# Patient Record
Sex: Male | Born: 1935 | Race: White | Hispanic: No | Marital: Married | Smoking: Former smoker
Health system: Southern US, Community
[De-identification: ages and names within clinical notes are randomized; demographics above are authoritative.]

## PROBLEM LIST (undated history)

## (undated) DIAGNOSIS — E785 Hyperlipidemia, unspecified: Secondary | ICD-10-CM

## (undated) DIAGNOSIS — I10 Essential (primary) hypertension: Secondary | ICD-10-CM

## (undated) DIAGNOSIS — F0390 Unspecified dementia without behavioral disturbance: Secondary | ICD-10-CM

## (undated) DIAGNOSIS — F0280 Dementia in other diseases classified elsewhere without behavioral disturbance: Secondary | ICD-10-CM

## (undated) DIAGNOSIS — G309 Alzheimer's disease, unspecified: Secondary | ICD-10-CM

## (undated) DIAGNOSIS — G5790 Unspecified mononeuropathy of unspecified lower limb: Secondary | ICD-10-CM

## (undated) HISTORY — PX: EYE SURGERY: SHX253

## (undated) HISTORY — PX: HERNIA REPAIR: SHX51

---

## 1998-06-25 ENCOUNTER — Inpatient Hospital Stay (HOSPITAL_COMMUNITY): Admission: AD | Admit: 1998-06-25 | Discharge: 1998-06-27 | Payer: Self-pay | Admitting: *Deleted

## 2001-02-24 ENCOUNTER — Encounter: Payer: Self-pay | Admitting: Family Medicine

## 2001-02-24 ENCOUNTER — Encounter: Admission: RE | Admit: 2001-02-24 | Discharge: 2001-02-24 | Payer: Self-pay | Admitting: Family Medicine

## 2001-03-29 ENCOUNTER — Encounter: Payer: Self-pay | Admitting: General Surgery

## 2001-03-30 ENCOUNTER — Ambulatory Visit (HOSPITAL_COMMUNITY): Admission: RE | Admit: 2001-03-30 | Discharge: 2001-03-31 | Payer: Self-pay | Admitting: General Surgery

## 2006-03-25 ENCOUNTER — Ambulatory Visit: Payer: Self-pay | Admitting: Gastroenterology

## 2006-04-08 ENCOUNTER — Ambulatory Visit: Payer: Self-pay | Admitting: Gastroenterology

## 2006-05-10 ENCOUNTER — Encounter: Admission: RE | Admit: 2006-05-10 | Discharge: 2006-05-10 | Payer: Self-pay | Admitting: Family Medicine

## 2006-07-01 ENCOUNTER — Encounter: Admission: RE | Admit: 2006-07-01 | Discharge: 2006-07-01 | Payer: Self-pay | Admitting: Family Medicine

## 2007-07-18 ENCOUNTER — Encounter: Admission: RE | Admit: 2007-07-18 | Discharge: 2007-07-18 | Payer: Self-pay | Admitting: General Surgery

## 2007-08-15 ENCOUNTER — Ambulatory Visit (HOSPITAL_COMMUNITY): Admission: RE | Admit: 2007-08-15 | Discharge: 2007-08-17 | Payer: Self-pay | Admitting: General Surgery

## 2007-08-15 ENCOUNTER — Encounter (INDEPENDENT_AMBULATORY_CARE_PROVIDER_SITE_OTHER): Payer: Self-pay | Admitting: General Surgery

## 2009-02-25 ENCOUNTER — Telehealth (INDEPENDENT_AMBULATORY_CARE_PROVIDER_SITE_OTHER): Payer: Self-pay

## 2009-02-26 ENCOUNTER — Ambulatory Visit: Payer: Self-pay

## 2009-02-26 ENCOUNTER — Encounter: Payer: Self-pay | Admitting: Cardiovascular Disease

## 2011-04-07 NOTE — Op Note (Signed)
NAMEDANZELL, BIRKY NO.:  [id number]   MEDICAL RECORD NO.:  [id number]          PATIENT TYPE:  AMB   LOCATION:  SDS                          FACILITY:  [facility]   PHYSICIAN:  Ollen Gross. Vernell Morgans, M.D. DATE OF BIRTH:  1927/11/21   DATE OF PROCEDURE:  08/15/2007  DATE OF DISCHARGE:                               OPERATIVE REPORT   PREOPERATIVE DIAGNOSIS:  Bilateral inguinal hernias.  The right side is  recurrent.   POSTOPERATIVE DIAGNOSIS:  Bilateral inguinal hernias.  The right side is  recurrent.   PROCEDURE:  Bilateral inguinal hernia repairs with mesh.   SURGEON:  Ollen Gross. Vernell Morgans, M.D.   ASSISTANT:  Currie Paris, M.D.   ANESTHESIA:  General endotracheal.   PROCEDURE:  After informed consent was obtained, the patient was brought  to the operating room and placed in supine position on the operating  room table.  After induction of general anesthesia, the patient's  abdomen and both groins were prepped with Betadine and draped in the  usual sterile manner.   Attention was first turned to the right inguinal region.  This was the  recurrent side.  This area was infiltrated with 0.25% Marcaine.  A  incision was made from the edge of the pubic tubercle on the right, and  along the patient's old incision.  This incision was carried down  through the skin and subcutaneous tissue sharply with the  electrocautery, until the fascia of the external oblique was  encountered.  We did encounter a fair bit of scar tissue, but were able  to identify the layers.  The external oblique was then opened along its  fibers towards the apex of the external ring with Metzenbaum scissors  using blunt hemostat dissection as well as some sharp dissection with  electrocautery.  The space between the transversalis muscle and external  oblique fascia were able to be identified and separated.  The patient  had a small area of fat in the inguinal canal, that I think corresponded  to  the CT scan.  It was not clear whether this was some preperitoneal  fat herniating into the canal.  It did not appear to be a hernia sac.  It was ligated near its base with a hemostat and 3-0 silk tie.  There  was a small defect at the internal ring.  A small mesh plug was placed  through this defect, and then anchored to the edge of the defect where  the old piece of mesh was with interrupted 2-0 Prolene stitches.  This  seemed to close that defect very well.  The external oblique was then  reapproximated with a running 2-0 Vicryl stitch.  The subcutaneous  tissue was closed with a running 3-0 Vicryl stitch, and the skin was  closed with a running 4-0 Monocryl subcuticular stitch.  Attention was  then turned to the left side.  The left inguinal region was infiltrated  with 0.25% Marcaine.  An incision was made from the edge of the pubic  tubercle on the left, towards the anterior cephalic spine.  This  incision was carried down through the skin and subcutaneous tissue  sharply with the electrocautery, until the fascia of the external  oblique was encountered.  The fascia of the external oblique was opened  along its fibers with a 15 blade knife and Metzenbaum scissors.  A  Weitlander retractor was deployed.  Using blunt dissection the cord  structures were able to be dissected free and surrounded between two  fingers at the edge of the pubic tubercle.  A 1/2-inch Penrose drain was  then placed around the cord structures for retraction purposes.  The  cord structure was then gently skeletonized by a combination of blunt  hemostat dissection and some sharp dissection with electrocautery.  The  hernia sac was able to be identified.  It was gently separated from the  rest of the cord structures.  The sac was then opened sharply with  Metzenbaum scissors.  There were no visceral contents within the sac.  The sac was then ligated near its base with a 2-0 silk suture ligature.  The distal sac  was excised and sent to pathology.  The stump of the sac  was allowed to retract back beneath the transversalis layer.   Next, a 3x6 piece of UltraPro mesh was chosen and cut to fit.  The mesh  was sewed inferiorly to the shelving edge of inguinal ligament with a  running 2-0 Prolene stitch.  Tails were cut in the mesh laterally.  These tails were wrapped around the cord structures and anchored lateral  to the cord and to the shelving edge of the inguinal ligament with an  interrupted 2-0 Prolene stitch superiorly.  The mesh was sewed to the  muscular aponeurotic strength layer of the transversalis with  interrupted 2-0 Prolene vertical mattress stitches.  The wound was  irrigated with copious amounts of saline.  At this point the hernia  defect was well repaired and the mesh was in good position without any  tension.  The external oblique fascia was then reapproximated with a  running 2-0 Vicryl stitch.  Prior to this, the ilioinguinal nerve was  also identified at the beginning of the case; it was dissected out and  clamped proximally and distally with the hemostats; divided and ligated  with 3-0 silk ties.  We never did see an ilioinguinal nerve really on  the right side that we were sure was nerve.  We did find structure that  looked like some nerve.  It was clamped with a hemostat, divided and  ligated with 3-0 silk ties as well.   Next, once the external oblique was approximated the wound was  infiltrated more 0.25% Marcaine.  The subcutaneous fascia was closed  with a running 3-0 Vicryl stitch, and the skin was closed with a running  4-0 Monocryl subcuticular stitch.  Dermabond dressing was applied.  The  patient tolerated the procedure well.  At the end of the case all  needle, sponge and instrument counts were correct.  The patient was then  awakened, taken to recovery room in stable condition.  Both testicles  were in the scrotum at the end the case.      Ollen Gross. Vernell Morgans,  M.D.  Electronically Signed     PST/MEDQ  D:  08/15/2007  T:  08/15/2007  Job:  [id number]

## 2011-04-10 NOTE — Op Note (Signed)
[place]  Patient:    David Vaughan, David Vaughan                       MRN: [id number] Proc. Date: 03/30/01 Adm. Date:  [id number] Disc. Date: [id number] Attending:  Tempie Donning CC:         Quita Skye. Artis Flock, M.D.   Operative Report  PREOPERATIVE DIAGNOSIS:  Repair of right inguinal hernia, primarily direct, with preperitoneal and onlay Prolene mesh.  SURGEON:  Gita Kudo, M.D.  ANESTHESIA:  MAC, IV sedation, local 1% Xylocaine, 0.5% Marcaine.  PREOPERATIVE DIAGNOSIS:  Right inguinal hernia.  POSTOPERATIVE DIAGNOSIS:  Right inguinal hernia, indirect.  CLINICAL SUMMARY:  Very pleasant 77 year old retired gentleman with bulge in his right groin and physical examination revealed a hernia.  OPERATIVE FINDINGS:  The patient had a medium-sized right indirect inguinal hernia that was empty of contents at time of surgery.  There was a weakening of the floor of the canal but no definite direct hernia.  DESCRIPTION OF PROCEDURE:  Under satisfactory IV sedation, having received 1 g of Ancef preoperatively, the patients abdomen and genitalia were prepped and draped in the standard fashion.  The above local was infiltrated throughout for good analgesia using approximately 60-80 cc.  A transverse incision was made in the right lower quadrant and carried down to and through the external ring and external oblique.  Bleeders clamped or coagulated.  They were tied with 3-0 Vicryl.  With good self-retaining retractors giving exposure, the cord and its contents were mobilized and the indirect sac identified and dissected high.  It was noted to be empty, twisted and controlled at the neck with 0 Prolene suture ligature and the end of the suture left long.  Then the floor of the canal was opened medially from the pubis, to the internal ring. The preperitoneal contents were reduced by finger dissection and held in place with a moist gauze.  A portion of 3 x 6  inch piece of mesh was tailored into an oval to fit into the space and it was anchored at the Coopers ligament with a 0 Prolene suture.  It then unfolded laterally and inferiorly.  The gauze was removed and the upper portion of the mesh unfolded medially and superiorly, covering the preperitoneal space well.  Then the floor was approximated over this with a running 0 Prolene suture taking intermittent bites of the mesh and at the internal ring, when tied, the ring was snug and the end of the suture left long.  The remainder of the mesh was then cut into an oval with a slit to go around the cord structures.  It was anchored at the ring with the previous suture and then tacked around the periphery under slight tension with 0 Prolene to the internal oblique above and the inguinal ligament below.  The tails were then sutured to each other in the fascia above and lateral to the cord.  The wound was then lavaged with saline and closed in layers with running 2-0 Vicryl for external oblique, interrupted 2-0 Vicryl for the deep fascia, and 3-0 Vicryl for the subcu.  Steri-Strips approximated the skin edges and sterile absorbing dressings applied.  There were no complications. DD:  03/30/01 TD:  03/30/01 Job: [id number]

## 2011-08-12 ENCOUNTER — Emergency Department (HOSPITAL_BASED_OUTPATIENT_CLINIC_OR_DEPARTMENT_OTHER)
Admission: EM | Admit: 2011-08-12 | Discharge: 2011-08-12 | Disposition: A | Discharge status: Home / Self Care | Payer: Medicare Other | Attending: Emergency Medicine | Admitting: Emergency Medicine

## 2011-08-12 DIAGNOSIS — Z79899 Other long term (current) drug therapy: Secondary | ICD-10-CM | POA: Insufficient documentation

## 2011-08-12 DIAGNOSIS — G309 Alzheimer's disease, unspecified: Secondary | ICD-10-CM | POA: Insufficient documentation

## 2011-08-12 DIAGNOSIS — W1809XA Striking against other object with subsequent fall, initial encounter: Secondary | ICD-10-CM | POA: Insufficient documentation

## 2011-08-12 DIAGNOSIS — I10 Essential (primary) hypertension: Secondary | ICD-10-CM | POA: Insufficient documentation

## 2011-08-12 DIAGNOSIS — S0101XA Laceration without foreign body of scalp, initial encounter: Secondary | ICD-10-CM

## 2011-08-12 DIAGNOSIS — E785 Hyperlipidemia, unspecified: Secondary | ICD-10-CM | POA: Insufficient documentation

## 2011-08-12 DIAGNOSIS — F0280 Dementia in other diseases classified elsewhere without behavioral disturbance: Secondary | ICD-10-CM | POA: Insufficient documentation

## 2011-08-12 DIAGNOSIS — S0100XA Unspecified open wound of scalp, initial encounter: Secondary | ICD-10-CM | POA: Insufficient documentation

## 2011-08-12 HISTORY — DX: Hyperlipidemia, unspecified: E78.5

## 2011-08-12 HISTORY — DX: Essential (primary) hypertension: I10

## 2011-08-12 HISTORY — DX: Alzheimer's disease, unspecified: G30.9

## 2011-08-12 HISTORY — DX: Dementia in other diseases classified elsewhere, unspecified severity, without behavioral disturbance, psychotic disturbance, mood disturbance, and anxiety: F02.80

## 2011-08-12 MED ORDER — LIDOCAINE-EPINEPHRINE-TETRACAINE (LET) SOLUTION
NASAL | Status: AC
  Administered 2011-08-12: 3 mL via TOPICAL
  Filled 2011-08-12: qty 3

## 2011-08-12 MED ORDER — TETANUS-DIPHTHERIA TOXOIDS TD 5-2 LFU IM INJ
0.5000 mL | INJECTION | Freq: Once | INTRAMUSCULAR | Status: AC
  Administered 2011-08-12: 0.5 mL via INTRAMUSCULAR
  Filled 2011-08-12: qty 0.5

## 2011-08-12 MED ORDER — LIDOCAINE-EPINEPHRINE-TETRACAINE (LET) SOLUTION
3.0000 mL | Freq: Once | NASAL | Status: AC
  Administered 2011-08-12: 3 mL via TOPICAL

## 2011-08-12 MED ORDER — TETANUS-DIPHTHERIA TOXOIDS TD 5-2 LFU IM INJ: 0.5000 mL | INJECTION | Freq: Once | INTRAMUSCULAR | Status: DC

## 2011-08-12 NOTE — ED Provider Notes (Signed)
History     CSN: [id number] Arrival date & time: 08/12/2011  8:20 AM   Chief Complaint  Patient presents with  . Fall  . Head Laceration     (Include location/radiation/quality/duration/timing/severity/associated sxs/prior treatment) Patient is a 77 y.o. male presenting with fall and scalp laceration. The history is provided by the EMS personnel and the patient.  Fall  Head Laceration   patient fell today and struck his head against a sharp object. No loss of consciousness patient ambulatory EMS was called patient is at his baseline currently nothing makes the symptoms better or worse denies neck back chest or abdomen pain no weakness or numbness in his extremities there was bandage  applied by EMS   Past Medical History  Diagnosis Date  . Hypertension   . Hyperlipidemia   . Alzheimer disease      No past surgical history on file.  No family history on file.  History  Substance Use Topics  . Smoking status: Not on file  . Smokeless tobacco: Not on file  . Alcohol Use:       Review of Systems  All other systems reviewed and are negative.    Allergies  Codeine and Iohexol  Home Medications   Current Outpatient Rx  Name Route Sig Dispense Refill  . DIVALPROEX SODIUM 125 MG PO CPSP Oral Take 125 mg by mouth 2 (two) times daily.      . DONEPEZIL HCL 10 MG PO TABS Oral Take 10 mg by mouth at bedtime as needed.      Marland Kitchen SIMVASTATIN 10 MG PO TABS Oral Take 10 mg by mouth at bedtime.        Physical Exam    BP 130/66  Pulse 57  Temp(Src) 98.4 F (36.9 C) (Oral)  Resp 16  Wt 210 lb (95.255 kg)  SpO2 98%  Physical Exam  Nursing note and vitals reviewed. Constitutional: He is oriented to person, place, and time. Vital signs are normal. He appears well-developed and well-nourished.  Non-toxic appearance. No distress.  HENT:  Head: Head is with laceration. Head is without Battle's sign.    Eyes: Conjunctivae and EOM are normal. Pupils are equal, round, and  reactive to light.  Neck: Normal range of motion. Neck supple. No tracheal deviation present.  Cardiovascular: Normal rate, regular rhythm and normal heart sounds.  Exam reveals no gallop.   No murmur heard. Pulmonary/Chest: Effort normal and breath sounds normal. No stridor. No respiratory distress. He has no wheezes.  Abdominal: Soft. Normal appearance and bowel sounds are normal. He exhibits no distension. There is no tenderness. There is no rebound.  Musculoskeletal: Normal range of motion. He exhibits no edema and no tenderness.  Neurological: He is alert and oriented to person, place, and time. He has normal strength. No cranial nerve deficit or sensory deficit. GCS eye subscore is 4. GCS verbal subscore is 5. GCS motor subscore is 6.  Skin: Skin is warm and dry.  Psychiatric: He has a normal mood and affect. His speech is normal and behavior is normal.    ED Course  LACERATION REPAIR Date/Time: 08/12/2011 8:36 AM Performed by: Toy Baker Authorized by: Lorre Nick T Consent: Verbal consent not obtained. The procedure was performed in an emergent situation. Risks and benefits: risks, benefits and alternatives were discussed Patient identity confirmed: arm band Body area: head/neck Location details: scalp Laceration length: 5 cm Foreign bodies: no foreign bodies Vascular damage: no Patient sedated: no Irrigation solution: saline Amount of  cleaning: standard Debridement: none Degree of undermining: none Skin closure: staples Number of sutures: 5 Dressing: gauze roll Patient tolerance: Patient tolerated the procedure well with no immediate complications.    No results found for this or any previous visit. No results found.   No diagnosis found.   MDM Tetanus updated by nursing and discharge instructions given       Toy Baker, MD 08/12/11 [phone]

## 2011-08-12 NOTE — ED Notes (Signed)
Pt denies pain, shob, dizziness. Pt alert and smiling. Wife with pt.

## 2011-08-12 NOTE — ED Notes (Signed)
Pt is at baseline per family.

## 2011-08-12 NOTE — ED Notes (Signed)
Unwitnessed fall while ambulating.  Pt struck head on dresser.  Laceration to scalp.  Denies LOC.

## 2011-08-19 ENCOUNTER — Encounter (HOSPITAL_BASED_OUTPATIENT_CLINIC_OR_DEPARTMENT_OTHER): Payer: Self-pay | Admitting: *Deleted

## 2011-08-19 ENCOUNTER — Emergency Department (HOSPITAL_BASED_OUTPATIENT_CLINIC_OR_DEPARTMENT_OTHER)
Admission: EM | Admit: 2011-08-19 | Discharge: 2011-08-19 | Disposition: A | Discharge status: Home / Self Care | Payer: Medicare Other | Attending: Emergency Medicine | Admitting: Emergency Medicine

## 2011-08-19 DIAGNOSIS — IMO0002 Reserved for concepts with insufficient information to code with codable children: Secondary | ICD-10-CM

## 2011-08-19 DIAGNOSIS — Z4802 Encounter for removal of sutures: Secondary | ICD-10-CM | POA: Insufficient documentation

## 2011-08-19 NOTE — ED Notes (Signed)
Patient is here for staple removal from posterior head.  Patient has hx alzheimers .

## 2011-08-19 NOTE — ED Provider Notes (Signed)
History     CSN: [id number] Arrival date & time: 08/19/2011 11:07 AM  Chief Complaint  Patient presents with  . Suture / Staple Removal    (Consider location/radiation/quality/duration/timing/severity/associated sxs/prior treatment) HPI Comments: Patient had a scalp laceration one week ago today. He was seen in this emergency department and had staples placed. Since that time he is showered with a cap Covering his scalp and his wife is placed antibiotic ointment on it more than once a day. Patient is otherwise well. There's been no drainage or fevers. In there is a scab over the site. They present here for staple removal. The wife does give most of the history since the patient himself has Alzheimer's.  Patient is a 77 y.o. male presenting with suture removal. The history is provided by the spouse, medical records and the patient. No language interpreter was used.  Suture / Staple Removal  The sutures were placed 7 to 10 days ago. Treatments since wound repair include antibiotic ointment use. There has been no drainage from the wound. There is no redness present. There is no swelling present. The pain has improved.    Past Medical History  Diagnosis Date  . Hypertension   . Hyperlipidemia   . Alzheimer disease     Past Surgical History  Procedure Date  . Hernia repair   . Eye surgery     No family history on file.  History  Substance Use Topics  . Smoking status: Never Smoker   . Smokeless tobacco: Not on file  . Alcohol Use: No      Review of Systems  Constitutional: Negative.  Negative for fever and chills.  HENT: Negative.   Eyes: Negative.  Negative for discharge and redness.  Respiratory: Negative.  Negative for cough and shortness of breath.   Cardiovascular: Negative.  Negative for chest pain.  Gastrointestinal: Negative.  Negative for nausea, vomiting and abdominal pain.  Genitourinary: Negative.  Negative for hematuria.  Musculoskeletal: Negative.  Negative  for back pain.  Skin: Positive for wound. Negative for color change and rash.  Neurological: Negative for syncope and headaches.  Hematological: Negative.  Negative for adenopathy.  Psychiatric/Behavioral: Negative.  Negative for confusion.  All other systems reviewed and are negative.    Allergies  Codeine and Iohexol  Home Medications   Current Outpatient Rx  Name Route Sig Dispense Refill  . DIVALPROEX SODIUM 125 MG PO CPSP Oral Take 125 mg by mouth 2 (two) times daily.      . DONEPEZIL HCL 10 MG PO TABS Oral Take 10 mg by mouth at bedtime as needed.      Marland Kitchen SIMVASTATIN 10 MG PO TABS Oral Take 10 mg by mouth at bedtime.        BP 120/67  Pulse 67  Temp(Src) 98.3 F (36.8 C) (Oral)  Resp 18  SpO2 100%  Physical Exam  Constitutional: He appears well-developed and well-nourished.  HENT:       In the posterior right scalp there is a 5 cm wound. By staples are in place. There is a scabbing crusting at the site but no erythema or drainage. The wound appears well-healed  Eyes: Conjunctivae and EOM are normal. Pupils are equal, round, and reactive to light.  Neck: Normal range of motion. Neck supple.  Pulmonary/Chest: Effort normal.  Neurological: He is alert.  Skin: Skin is warm and dry.  Psychiatric: He has a normal mood and affect. His behavior is normal.    ED Course  SUTURE  REMOVAL Date/Time: 08/19/2011 11:14 AM Performed by: Emeline General A Authorized by: Emeline General A Consent: Verbal consent obtained. Written consent not obtained. Risks and benefits: risks, benefits and alternatives were discussed Consent given by: patient and spouse Patient understanding: patient states understanding of the procedure being performed Patient consent: the patient's understanding of the procedure matches consent given Patient identity confirmed: verbally with patient Body area: head/neck Location details: scalp Wound Appearance: clean Staples Removed: 5 Facility: sutures  placed in this facility Patient tolerance: Patient tolerated the procedure well with no immediate complications.   (including critical care time)  Labs Reviewed - No data to display No results found.   No diagnosis found.    MDM  Patient had staples removed without further complications. He'll be discharged home. They've been given further wound care instruction as well.        Nat Christen, MD 08/19/11 1115

## 2011-09-03 LAB — DIFFERENTIAL
Basophils Absolute: 0
Basophils Relative: 0
Eosinophils Absolute: 0.1
Neutrophils Relative %: 65

## 2011-09-03 LAB — CBC
MCHC: 33.3
MCV: 93.5
Platelets: 190
WBC: 6.1

## 2011-09-03 LAB — BASIC METABOLIC PANEL
BUN: 22
CO2: 27
Chloride: 101
Creatinine, Ser: 0.88

## 2011-09-22 ENCOUNTER — Other Ambulatory Visit: Payer: Self-pay | Admitting: Dermatology

## 2012-03-14 ENCOUNTER — Other Ambulatory Visit: Payer: Self-pay | Admitting: Dermatology

## 2012-08-23 ENCOUNTER — Emergency Department (HOSPITAL_COMMUNITY)
Admission: EM | Admit: 2012-08-23 | Discharge: 2012-08-24 | Disposition: A | Discharge status: Skilled Nursing Facility | Payer: Medicare Other | Attending: Emergency Medicine | Admitting: Emergency Medicine

## 2012-08-23 ENCOUNTER — Emergency Department (HOSPITAL_COMMUNITY): Payer: Medicare Other

## 2012-08-23 ENCOUNTER — Encounter (HOSPITAL_COMMUNITY): Payer: Self-pay

## 2012-08-23 DIAGNOSIS — S0003XA Contusion of scalp, initial encounter: Secondary | ICD-10-CM | POA: Insufficient documentation

## 2012-08-23 DIAGNOSIS — G5790 Unspecified mononeuropathy of unspecified lower limb: Secondary | ICD-10-CM | POA: Insufficient documentation

## 2012-08-23 DIAGNOSIS — W19XXXA Unspecified fall, initial encounter: Secondary | ICD-10-CM

## 2012-08-23 DIAGNOSIS — S0083XA Contusion of other part of head, initial encounter: Secondary | ICD-10-CM

## 2012-08-23 DIAGNOSIS — E785 Hyperlipidemia, unspecified: Secondary | ICD-10-CM | POA: Insufficient documentation

## 2012-08-23 DIAGNOSIS — F0280 Dementia in other diseases classified elsewhere without behavioral disturbance: Secondary | ICD-10-CM | POA: Insufficient documentation

## 2012-08-23 DIAGNOSIS — S1093XA Contusion of unspecified part of neck, initial encounter: Secondary | ICD-10-CM | POA: Insufficient documentation

## 2012-08-23 DIAGNOSIS — G309 Alzheimer's disease, unspecified: Secondary | ICD-10-CM | POA: Insufficient documentation

## 2012-08-23 DIAGNOSIS — F0390 Unspecified dementia without behavioral disturbance: Secondary | ICD-10-CM

## 2012-08-23 DIAGNOSIS — I10 Essential (primary) hypertension: Secondary | ICD-10-CM | POA: Insufficient documentation

## 2012-08-23 DIAGNOSIS — W07XXXA Fall from chair, initial encounter: Secondary | ICD-10-CM | POA: Insufficient documentation

## 2012-08-23 HISTORY — DX: Unspecified dementia, unspecified severity, without behavioral disturbance, psychotic disturbance, mood disturbance, and anxiety: F03.90

## 2012-08-23 HISTORY — DX: Unspecified mononeuropathy of unspecified lower limb: G57.90

## 2012-08-23 NOTE — ED Notes (Signed)
Pt from [facility] facility. Pt stood up from chair in an up right position and fell face down to a hard floor that was witnessed. Pt has a hx of Alz. Disease. Denies any pain. Pt is calm, alert and orientx1. Staff noticed that after fall pt hands began twitching.

## 2012-08-23 NOTE — ED Provider Notes (Signed)
History     CSN: [id number]  Arrival date & time 08/23/12  2208   First MD Initiated Contact with Patient 08/23/12 2240      Chief Complaint  Patient presents with  . Fall    (Consider location/radiation/quality/duration/timing/severity/associated sxs/prior treatment) Patient is a 78 y.o. male presenting with fall. The history is provided by the patient and the spouse. The history is limited by the condition of the patient.  Fall  pt from [facility], hx advanced dementia, got up from chair without his walker, witnessed fall, hit head, contusion to forehead. No loc. Mental status c/w baseline. No vomiting. Pt w advanced dementia, limited hx, level 5 caveat.      Past Medical History  Diagnosis Date  . Hypertension   . Hyperlipidemia   . Alzheimer disease   . Dementia   . Neuropathy of lower extremity     Past Surgical History  Procedure Date  . Hernia repair   . Eye surgery     No family history on file.  History  Substance Use Topics  . Smoking status: Former Games developer  . Smokeless tobacco: Not on file  . Alcohol Use: No      Review of Systems  Unable to perform ROS: Dementia    Allergies  Codeine and Iohexol  Home Medications   Current Outpatient Rx  Name Route Sig Dispense Refill  . DIVALPROEX SODIUM 125 MG PO CPSP Oral Take 125 mg by mouth 2 (two) times daily.      . DONEPEZIL HCL 10 MG PO TABS Oral Take 10 mg by mouth at bedtime as needed.      Marland Kitchen SIMVASTATIN 10 MG PO TABS Oral Take 10 mg by mouth at bedtime.        BP 124/76  Pulse 67  Temp 97.6 F (36.4 C) (Oral)  Resp 18  SpO2 99%  Physical Exam  Nursing note and vitals reviewed. Constitutional: He appears well-developed and well-nourished. No distress.  HENT:  Nose: Nose normal.  Mouth/Throat: Oropharynx is clear and moist.       Contusion to forehead  Eyes: Pupils are equal, round, and reactive to light.  Neck: Neck supple. No tracheal deviation present.  Cardiovascular: Normal rate,  regular rhythm, normal heart sounds and intact distal pulses.   Pulmonary/Chest: Effort normal and breath sounds normal. No accessory muscle usage. No respiratory distress. He exhibits no tenderness.  Abdominal: Soft. He exhibits no distension. There is no tenderness.  Musculoskeletal: Normal range of motion. He exhibits no edema and no tenderness.       CTLS spine, non tender, aligned, no step off. Good rom bil ext without focal sts, pain or bony tenderness.  Neurological: He is alert.       Alert, content. Moves bil extremities purposefully. Ms c/w baseline per spouse.   Skin: Skin is warm and dry.    ED Course  Procedures (including critical care time)  Ct Head Wo Contrast  08/23/2012  *RADIOLOGY REPORT*  Clinical Data: Fall.  Frontal head injury.  CT HEAD WITHOUT CONTRAST  Technique:  Contiguous axial images were obtained from the base of the skull through the vertex without contrast.  Comparison: 05/10/2006  Findings: No evidence for acute hemorrhage.  No mass lesion or acute ischemia by CT.  No abnormal extra-axial fluid collection. Diffuse loss of parenchymal volume is consistent with atrophy. Patchy low attenuation in the deep hemispheric and periventricular white matter is nonspecific, but likely reflects chronic microvascular ischemic demyelination.  The  lateral ventricles are enlarged bilaterally.  This was true previously, but has progressed in the interval along with the progression of the diffuse parenchymal volume loss.  Visualized paranasal sinuses are clear.  No evidence for skull fracture.  Focal scalp swelling is seen in the left high frontal parietal region.  IMPRESSION: No CT evidence for acute intracranial abnormality.  Advanced atrophy with chronic small vessel white matter disease. The lateral ventricles are more prominent than before and while this most likely represents progression of central atrophy, hydrocephalus could have a similar appearance.   Original Report  Authenticated By: ERIC A. MANSELL, M.D.       MDM  Ct.  Reviewed nursing notes and prior charts for additional history.   Recheck alert, content. Nad. Appears stable for d/c.         Suzi Roots, MD 08/23/12 2321

## 2012-08-23 NOTE — ED Notes (Signed)
MWU:XL24<MW> Expected date:08/23/12<BR> Expected time: 9:55 PM<BR> Means of arrival:Ambulance<BR> Comments:<BR> Fall  85 F

## 2012-09-18 ENCOUNTER — Emergency Department (HOSPITAL_COMMUNITY)
Admission: EM | Admit: 2012-09-18 | Discharge: 2012-09-18 | Disposition: A | Discharge status: Skilled Nursing Facility | Payer: Medicare Other | Attending: Emergency Medicine | Admitting: Emergency Medicine

## 2012-09-18 ENCOUNTER — Emergency Department (HOSPITAL_COMMUNITY): Payer: Medicare Other

## 2012-09-18 ENCOUNTER — Encounter (HOSPITAL_COMMUNITY): Payer: Self-pay | Admitting: Nurse Practitioner

## 2012-09-18 DIAGNOSIS — Z79899 Other long term (current) drug therapy: Secondary | ICD-10-CM | POA: Insufficient documentation

## 2012-09-18 DIAGNOSIS — R233 Spontaneous ecchymoses: Secondary | ICD-10-CM

## 2012-09-18 DIAGNOSIS — G309 Alzheimer's disease, unspecified: Secondary | ICD-10-CM | POA: Insufficient documentation

## 2012-09-18 DIAGNOSIS — M7989 Other specified soft tissue disorders: Secondary | ICD-10-CM

## 2012-09-18 DIAGNOSIS — I10 Essential (primary) hypertension: Secondary | ICD-10-CM | POA: Insufficient documentation

## 2012-09-18 DIAGNOSIS — G5790 Unspecified mononeuropathy of unspecified lower limb: Secondary | ICD-10-CM | POA: Insufficient documentation

## 2012-09-18 DIAGNOSIS — E785 Hyperlipidemia, unspecified: Secondary | ICD-10-CM | POA: Insufficient documentation

## 2012-09-18 DIAGNOSIS — T148XXA Other injury of unspecified body region, initial encounter: Secondary | ICD-10-CM

## 2012-09-18 DIAGNOSIS — F0280 Dementia in other diseases classified elsewhere without behavioral disturbance: Secondary | ICD-10-CM | POA: Insufficient documentation

## 2012-09-18 DIAGNOSIS — M7981 Nontraumatic hematoma of soft tissue: Secondary | ICD-10-CM | POA: Insufficient documentation

## 2012-09-18 DIAGNOSIS — Z87891 Personal history of nicotine dependence: Secondary | ICD-10-CM | POA: Insufficient documentation

## 2012-09-18 DIAGNOSIS — Z7982 Long term (current) use of aspirin: Secondary | ICD-10-CM | POA: Insufficient documentation

## 2012-09-18 DIAGNOSIS — G609 Hereditary and idiopathic neuropathy, unspecified: Secondary | ICD-10-CM | POA: Insufficient documentation

## 2012-09-18 LAB — POCT I-STAT, CHEM 8
BUN: 23 mg/dL (ref 6–23)
Calcium, Ion: 1.14 mmol/L (ref 1.13–1.30)
Chloride: 103 mEq/L (ref 96–112)
Creatinine, Ser: 0.8 mg/dL (ref 0.50–1.35)
Glucose, Bld: 90 mg/dL (ref 70–99)

## 2012-09-18 LAB — CBC
HCT: 40.8 % (ref 39.0–52.0)
MCV: 96.7 fL (ref 78.0–100.0)
RBC: 4.22 MIL/uL (ref 4.22–5.81)
WBC: 6 10*3/uL (ref 4.0–10.5)

## 2012-09-18 MED ORDER — MIDAZOLAM HCL 2 MG/2ML IJ SOLN
2.0000 mg | Freq: Once | INTRAMUSCULAR | Status: AC
  Administered 2012-09-18: 2 mg via INTRAMUSCULAR
  Filled 2012-09-18: qty 2

## 2012-09-18 MED ORDER — MIDAZOLAM HCL 2 MG/2ML IJ SOLN
INTRAMUSCULAR | Status: AC
  Administered 2012-09-18: 3 mg
  Filled 2012-09-18: qty 4

## 2012-09-18 MED ORDER — MIDAZOLAM HCL 2 MG/2ML IJ SOLN: 3.0000 mg | Freq: Once | INTRAMUSCULAR | Status: AC

## 2012-09-18 NOTE — ED Notes (Signed)
Spoke with pt's wife on phone regarding plan of care.

## 2012-09-18 NOTE — ED Provider Notes (Addendum)
Medical screening examination/treatment/procedure(s) were conducted as a shared visit with non-physician practitioner(s) and myself.  I personally evaluated the patient during the encounter  Doug Sou, MD 09/18/12 1708  Patient had x-rays of wrist and forearm obtained. He requires an x-ray of his right hand to rule out fracture. He requires sedation per x-ray technicians due to his tendency to be violent. This is baseline per his wife who accompanies him an additional Versed 2 mg IM ordered by me prior to sending patient back to x-ray  Doug Sou, MD 09/18/12 1737

## 2012-09-18 NOTE — ED Notes (Signed)
[facility] arrived at pt room transferred pt onto mobile stretcher. Report given.

## 2012-09-18 NOTE — ED Notes (Signed)
X 2 side rails  

## 2012-09-18 NOTE — Progress Notes (Signed)
VASCULAR LAB PRELIMINARY  PRELIMINARY  PRELIMINARY  PRELIMINARY  Right upper extremity venous Doppler completed.    Preliminary report:  No obvious evidence of DVT or SVT noted in the right upper extremity.  Technically difficult study secondary to combativeness.  Cordarro Spinnato, 09/18/2012, 3:17 PM

## 2012-09-18 NOTE — ED Notes (Signed)
Report given to Bowers, CNA at [facility] [phone]).  She states that she will feed pt when he returns.  [facility] called to transport pt.
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
## 2012-09-18 NOTE — ED Notes (Signed)
ems brought pt from SNF [facility] for swelling to R hand onset yesterday, from fingers to forearm. Staff denies any injuries. ems noted small lac to R eyebrow and bruising to L arm also. Pt is alert per norm, staff states pt is normally combative but has not been today.

## 2012-09-18 NOTE — ED Notes (Signed)
CBG 96. 
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
## 2012-09-18 NOTE — ED Notes (Signed)
Placed call to [facility] for transport back to [facility]

## 2012-09-18 NOTE — ED Notes (Signed)
Pt to xray
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
## 2012-09-18 NOTE — ED Provider Notes (Signed)
History     CSN: [id number]  Arrival date & time 09/18/12  1134   First MD Initiated Contact with Patient 09/18/12 1401      Chief Complaint  Patient presents with  . Joint Swelling    (Consider location/radiation/quality/duration/timing/severity/associated sxs/prior treatment) Patient is a 78 y.o. male presenting with hand pain. The history is provided by the patient. No language interpreter was used.  Hand Pain This is a new problem. The current episode started yesterday. The problem occurs constantly. The problem has been gradually worsening. Pertinent negatives include no fever, nausea, vomiting or weakness.   78 year old male coming in with hematemesis used right is in forearm. Staff at the [facility] for right hand swelling started yesterday and it is extending into his wrist and forearm today. Patient is a Alzheimer's patient who is confused combative and not cooperative. Patient's wife is at the bedside and states that he went to the [facility] on 08/03/2012. Patient is not oriented at all. He does surface and follow. He is aggressive and combative. Patient received Versed on admission for combative behavior. Patient is in 4. restraints on the stretcher presently. Wife reports that he does have combative behavior at the nursing facility as well. Past medical history listed below. No acute distress noted good radial pulse on the right is noted for some meter hematoma inside the right wrist. The hand is reddened and edematous.  Past Medical History  Diagnosis Date  . Hypertension   . Hyperlipidemia   . Alzheimer disease   . Dementia   . Neuropathy of lower extremity     Past Surgical History  Procedure Date  . Hernia repair   . Eye surgery     History reviewed. No pertinent family history.  History  Substance Use Topics  . Smoking status: Former Games developer  . Smokeless tobacco: Not on file  . Alcohol Use: No      Review of Systems  Unable to perform  ROS Constitutional: Negative for fever.  Gastrointestinal: Negative for nausea and vomiting.  Neurological: Negative for weakness.    Allergies  Codeine and Iohexol  Home Medications   Current Outpatient Rx  Name Route Sig Dispense Refill  . ASPIRIN 325 MG PO TBEC Oral Take 325 mg by mouth daily.    Marland Kitchen CALCIUM 600 + D PO Oral Take 1 tablet by mouth daily.    Marland Kitchen DIVALPROEX SODIUM ER 500 MG PO TB24 Oral Take 500 mg by mouth daily.    . DONEPEZIL HCL 10 MG PO TABS Oral Take 10 mg by mouth at bedtime as needed.    . DUTASTERIDE 0.5 MG PO CAPS Oral Take 0.5 mg by mouth daily.    . FUROSEMIDE 20 MG PO TABS Oral Take 20 mg by mouth daily. For 27 days. Pt started on 09/11/12    . GABAPENTIN 300 MG PO CAPS Oral Take 300 mg by mouth at bedtime.    . GUAIFENESIN 100 MG/5ML PO LIQD Oral Take 200 mg by mouth every 6 (six) hours as needed. For cough    . LORAZEPAM 2 MG/ML PO CONC Oral Take 0.25 mg by mouth every 6 (six) hours as needed. For agitation and every night at bedtime.    . THEREMS PO Oral Take 1 tablet by mouth daily.    . OMEGA-3-ACID ETHYL ESTERS 1 G PO CAPS Oral Take 2 g by mouth daily.    Marland Kitchen PRAVASTATIN SODIUM 20 MG PO TABS Oral Take 20 mg by mouth  daily.    . QUETIAPINE FUMARATE 25 MG PO TABS Oral Take 25 mg by mouth at bedtime.    Marland Kitchen VITAMIN B-12 1000 MCG PO TABS Oral Take 1,000 mcg by mouth daily.    . ACETAMINOPHEN 500 MG PO TABS Oral Take 500 mg by mouth every 6 (six) hours as needed. For fever or pain    . ALUM & MAG HYDROXIDE-SIMETH 200-200-20 MG/5ML PO SUSP Oral Take 30 mLs by mouth every 6 (six) hours as needed. For heartburn or indigestion.    Marland Kitchen LOPERAMIDE HCL 2 MG PO CAPS Oral Take 2 mg by mouth 4 (four) times daily as needed. For diarrhea.    Marland Kitchen MAGNESIUM HYDROXIDE 400 MG/5ML PO SUSP Oral Take 30 mLs by mouth at bedtime as needed. For constipation      BP 132/80  Pulse 70  Temp 97.8 F (36.6 C) (Oral)  Resp 20  SpO2 98%  Physical Exam  Nursing note and vitals  reviewed. Constitutional: He is oriented to person, place, and time. He appears well-developed and well-nourished.  HENT:  Head: Normocephalic.  Eyes: Conjunctivae normal and EOM are normal. Pupils are equal, round, and reactive to light.  Neck: Normal range of motion. Neck supple.  Cardiovascular: Normal rate.   Pulmonary/Chest: Effort normal.  Abdominal: Soft.  Musculoskeletal: Normal range of motion. He exhibits edema.       Right hand edema unable to determine if it does tender do to patient's Alzheimer's  Neurological: He is alert and oriented to person, place, and time.  Skin: Skin is warm and dry.  Psychiatric: He has a normal mood and affect.    ED Course  Procedures (including critical care time)   Labs Reviewed  PROTIME-INR  CBC   No results found.   No diagnosis found.    MDM   Report given to Dr. Rhunette Croft for this 78 year old combative, confused, Alzheimer's patient coming in with right hand swelling and edema and hematoma. Awaiting plain films. The ultrasound was negative for DVT in the right forearm. Labs unremarkable. INRs normal. EKG is unremarkable and unchanged. His wife is at the bedside  Labs Reviewed  PROTIME-INR  CBC  POCT I-STAT, CHEM 8         Remi Haggard, NP 09/18/12 1521

## 2012-09-18 NOTE — ED Notes (Signed)
Pt's wife leaving.  Phone number for RN given to wife so she can call to check on him.

## 2012-09-23 ENCOUNTER — Encounter (HOSPITAL_COMMUNITY): Payer: Self-pay | Admitting: *Deleted

## 2012-09-23 ENCOUNTER — Emergency Department (HOSPITAL_COMMUNITY)
Admission: EM | Admit: 2012-09-23 | Discharge: 2012-09-24 | Disposition: A | Discharge status: Other Acute Inpatient Hospital | Payer: Medicare Other | Attending: Emergency Medicine | Admitting: Emergency Medicine

## 2012-09-23 DIAGNOSIS — G309 Alzheimer's disease, unspecified: Secondary | ICD-10-CM | POA: Insufficient documentation

## 2012-09-23 DIAGNOSIS — F0281 Dementia in other diseases classified elsewhere with behavioral disturbance: Secondary | ICD-10-CM

## 2012-09-23 DIAGNOSIS — Z87891 Personal history of nicotine dependence: Secondary | ICD-10-CM | POA: Insufficient documentation

## 2012-09-23 DIAGNOSIS — E785 Hyperlipidemia, unspecified: Secondary | ICD-10-CM | POA: Insufficient documentation

## 2012-09-23 DIAGNOSIS — F02818 Dementia in other diseases classified elsewhere, unspecified severity, with other behavioral disturbance: Secondary | ICD-10-CM | POA: Insufficient documentation

## 2012-09-23 DIAGNOSIS — G5790 Unspecified mononeuropathy of unspecified lower limb: Secondary | ICD-10-CM | POA: Insufficient documentation

## 2012-09-23 DIAGNOSIS — I10 Essential (primary) hypertension: Secondary | ICD-10-CM | POA: Insufficient documentation

## 2012-09-23 DIAGNOSIS — F0280 Dementia in other diseases classified elsewhere without behavioral disturbance: Secondary | ICD-10-CM | POA: Insufficient documentation

## 2012-09-23 LAB — URINALYSIS, ROUTINE W REFLEX MICROSCOPIC
Glucose, UA: NEGATIVE mg/dL
Hgb urine dipstick: NEGATIVE
Specific Gravity, Urine: 1.035 — ABNORMAL HIGH (ref 1.005–1.030)

## 2012-09-23 LAB — CBC
Hemoglobin: 12.7 g/dL — ABNORMAL LOW (ref 13.0–17.0)
MCHC: 34 g/dL (ref 30.0–36.0)
Platelets: 162 10*3/uL (ref 150–400)

## 2012-09-23 LAB — URINE MICROSCOPIC-ADD ON
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
## 2012-09-23 NOTE — ED Notes (Signed)
WUJ:[id number]<JY> Expected date:09/23/12<BR> Expected time: 8:38 PM<BR> Means of arrival:Ambulance<BR> Comments:<BR> Behavioral issues  78yo M
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
## 2012-09-24 ENCOUNTER — Emergency Department (HOSPITAL_COMMUNITY): Payer: Medicare Other

## 2012-09-24 LAB — BASIC METABOLIC PANEL
BUN: 26 mg/dL — ABNORMAL HIGH (ref 6–23)
Calcium: 8.5 mg/dL (ref 8.4–10.5)
GFR calc Af Amer: >90 mL/min (ref >90)
GFR calc non Af Amer: 85 mL/min — ABNORMAL LOW (ref >90)
Potassium: 3.5 mEq/L (ref 3.5–5.1)
Sodium: 138 mEq/L (ref 135–145)

## 2012-09-24 LAB — HEPATIC FUNCTION PANEL
ALT: 19 U/L (ref 0–53)
AST: 29 U/L (ref 0–37)
Alkaline Phosphatase: 60 U/L (ref 39–117)
Bilirubin, Direct: 0.1 mg/dL (ref 0.0–0.3)
Total Bilirubin: 0.5 mg/dL (ref 0.3–1.2)

## 2012-09-24 LAB — RAPID URINE DRUG SCREEN, HOSP PERFORMED
Cocaine: NOT DETECTED
Opiates: NOT DETECTED
Tetrahydrocannabinol: NOT DETECTED

## 2012-09-24 MED ORDER — DONEPEZIL HCL 10 MG PO TABS
10.0000 mg | ORAL_TABLET | Freq: Every day | ORAL | Status: DC
  Filled 2012-09-24: qty 1

## 2012-09-24 MED ORDER — ONDANSETRON HCL 4 MG PO TABS: 4.0000 mg | ORAL_TABLET | Freq: Three times a day (TID) | ORAL | Status: DC | PRN

## 2012-09-24 MED ORDER — ASPIRIN EC 325 MG PO TBEC: 325.0000 mg | DELAYED_RELEASE_TABLET | Freq: Every day | ORAL | Status: DC

## 2012-09-24 MED ORDER — IBUPROFEN 200 MG PO TABS: 600.0000 mg | ORAL_TABLET | Freq: Three times a day (TID) | ORAL | Status: DC | PRN

## 2012-09-24 MED ORDER — GUAIFENESIN 100 MG/5ML PO LIQD: 200.0000 mg | Freq: Four times a day (QID) | ORAL | Status: DC | PRN

## 2012-09-24 MED ORDER — DIVALPROEX SODIUM 500 MG PO DR TAB: 500.0000 mg | DELAYED_RELEASE_TABLET | Freq: Every day | ORAL | Status: DC

## 2012-09-24 MED ORDER — LORAZEPAM 1 MG PO TABS: 1.0000 mg | ORAL_TABLET | Freq: Three times a day (TID) | ORAL | Status: DC | PRN

## 2012-09-24 MED ORDER — LOPERAMIDE HCL 2 MG PO CAPS: 2.0000 mg | ORAL_CAPSULE | Freq: Four times a day (QID) | ORAL | Status: DC | PRN

## 2012-09-24 MED ORDER — ACETAMINOPHEN 325 MG PO TABS: 650.0000 mg | ORAL_TABLET | ORAL | Status: DC | PRN

## 2012-09-24 MED ORDER — VITAMIN B-12 1000 MCG PO TABS
1000.0000 ug | ORAL_TABLET | Freq: Every day | ORAL | Status: DC
  Filled 2012-09-24: qty 1

## 2012-09-24 MED ORDER — GUAIFENESIN 100 MG/5ML PO SOLN
10.0000 mL | Freq: Four times a day (QID) | ORAL | Status: DC | PRN
  Filled 2012-09-24: qty 10

## 2012-09-24 MED ORDER — LORAZEPAM 1 MG PO TABS
1.0000 mg | ORAL_TABLET | Freq: Three times a day (TID) | ORAL | Status: DC | PRN
  Filled 2012-09-24: qty 1

## 2012-09-24 MED ORDER — ASPIRIN EC 325 MG PO TBEC
325.0000 mg | DELAYED_RELEASE_TABLET | Freq: Every day | ORAL | Status: DC
  Filled 2012-09-24: qty 1

## 2012-09-24 MED ORDER — DUTASTERIDE 0.5 MG PO CAPS
0.5000 mg | ORAL_CAPSULE | Freq: Every day | ORAL | Status: DC
  Filled 2012-09-24: qty 1

## 2012-09-24 MED ORDER — SIMVASTATIN 10 MG PO TABS
10.0000 mg | ORAL_TABLET | Freq: Every day | ORAL | Status: DC
  Filled 2012-09-24: qty 1

## 2012-09-24 MED ORDER — RISPERIDONE 0.5 MG PO TABS: 0.2500 mg | ORAL_TABLET | Freq: Every day | ORAL | Status: DC

## 2012-09-24 MED ORDER — GABAPENTIN 300 MG PO CAPS
300.0000 mg | ORAL_CAPSULE | Freq: Every day | ORAL | Status: DC
  Filled 2012-09-24: qty 1

## 2012-09-24 MED ORDER — MAGNESIUM HYDROXIDE 400 MG/5ML PO SUSP: 30.0000 mL | Freq: Every evening | ORAL | Status: DC | PRN

## 2012-09-24 MED ORDER — ALUM & MAG HYDROXIDE-SIMETH 200-200-20 MG/5ML PO SUSP: 30.0000 mL | Freq: Four times a day (QID) | ORAL | Status: DC | PRN

## 2012-09-24 MED ORDER — LORAZEPAM 2 MG/ML IJ SOLN
1.0000 mg | Freq: Four times a day (QID) | INTRAMUSCULAR | Status: DC | PRN
  Administered 2012-09-24: 1 mg via INTRAMUSCULAR
  Filled 2012-09-24: qty 1

## 2012-09-24 MED ORDER — LORAZEPAM 2 MG/ML IJ SOLN
1.0000 mg | Freq: Once | INTRAMUSCULAR | Status: AC
  Administered 2012-09-24: 1 mg via INTRAVENOUS
  Filled 2012-09-24: qty 1

## 2012-09-24 MED ORDER — ZOLPIDEM TARTRATE 5 MG PO TABS: 5.0000 mg | ORAL_TABLET | Freq: Every evening | ORAL | Status: DC | PRN

## 2012-09-24 NOTE — ED Notes (Addendum)
Pattricia Boss [phone] is patient wife, who will be going home. Please call on patient disposition.

## 2012-09-24 NOTE — ED Notes (Signed)
Family at bedside. Wife still at bedside
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
## 2012-09-24 NOTE — ED Provider Notes (Addendum)
Care assumed at sign out. David Vaughan is a 78 y.o. male hx of dementia sent by nursing home for more agitation. ACT saw patient and recommended placement at [facility]. Labs and CT head nl. CXR nl. EKG below. Pending placement.    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 Will transfer.   Richardean Canal, MD 09/24/12 [phone]
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
## 2012-09-24 NOTE — ED Notes (Signed)
Pt is calm and resting.  Medication will be held until lunch tray arrives, per MD.
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
## 2012-09-24 NOTE — ED Notes (Signed)
Patient transported to X-ray 

## 2012-09-24 NOTE — ED Notes (Signed)
Patient wife is at bedside. Patient will wake up during his sleep and pull the blankets off and patient will not keep his 02 sensor on his finger. Patient is on heart monitor.

## 2012-09-24 NOTE — ED Notes (Signed)
Per Belinda Block at the facility, per the facility administrator, the patient is not allowed to return to the facility.

## 2012-09-24 NOTE — ED Notes (Signed)
Pt. awake and aggressive unable to draw ethanol level at this time. RN aware.

## 2012-09-24 NOTE — ED Notes (Signed)
Pt's wife informed staff that she wants to change his code status to DNR.  Wife is his Medical POA.  Charge nurse has been notified and a message was left with Social Work.  Pt's wife was updated.

## 2012-09-24 NOTE — ED Notes (Signed)
Patient back in restraints at 1415

## 2012-09-24 NOTE — ED Notes (Signed)
Family at bedside.wife at bedside

## 2012-09-24 NOTE — ED Notes (Addendum)
Family at bedside: Wife at bedside

## 2012-09-24 NOTE — ED Notes (Signed)
Patient ate 100% of his lunch. His wife and the tech assisted him with eating. Patient was released from both wrist restraints at 1300 by Ranae Pila EMT
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
## 2012-09-24 NOTE — BHH Counselor (Signed)
Per Grace(RN), [facility], pt has been accepted by Dr. Janice Norrie, report needs to be called to [phone].

## 2012-09-24 NOTE — ED Provider Notes (Signed)
Accepted by Sandre Kitty. Pt in restraints; wife appreciative of Pt's care.  Hurman Horn, MD 09/25/12 [phone]

## 2012-09-24 NOTE — ED Notes (Signed)
EKG printed and given to EDP Ray for review. Last confirmed EKG printed for comparison. 

## 2012-09-24 NOTE — ED Notes (Signed)
ACT team with patient 

## 2012-11-04 ENCOUNTER — Emergency Department (HOSPITAL_COMMUNITY)
Admission: EM | Admit: 2012-11-04 | Discharge: 2012-11-04 | Disposition: A | Discharge status: Home / Self Care | Payer: Medicare Other | Attending: Emergency Medicine | Admitting: Emergency Medicine

## 2012-11-04 ENCOUNTER — Emergency Department (HOSPITAL_COMMUNITY): Payer: Medicare Other

## 2012-11-04 ENCOUNTER — Encounter (HOSPITAL_COMMUNITY): Payer: Self-pay | Admitting: Emergency Medicine

## 2012-11-04 DIAGNOSIS — IMO0002 Reserved for concepts with insufficient information to code with codable children: Secondary | ICD-10-CM | POA: Insufficient documentation

## 2012-11-04 DIAGNOSIS — Z79899 Other long term (current) drug therapy: Secondary | ICD-10-CM | POA: Insufficient documentation

## 2012-11-04 DIAGNOSIS — Y9389 Activity, other specified: Secondary | ICD-10-CM | POA: Insufficient documentation

## 2012-11-04 DIAGNOSIS — F0280 Dementia in other diseases classified elsewhere without behavioral disturbance: Secondary | ICD-10-CM | POA: Insufficient documentation

## 2012-11-04 DIAGNOSIS — W1809XA Striking against other object with subsequent fall, initial encounter: Secondary | ICD-10-CM | POA: Insufficient documentation

## 2012-11-04 DIAGNOSIS — Z87891 Personal history of nicotine dependence: Secondary | ICD-10-CM | POA: Insufficient documentation

## 2012-11-04 DIAGNOSIS — I10 Essential (primary) hypertension: Secondary | ICD-10-CM | POA: Insufficient documentation

## 2012-11-04 DIAGNOSIS — Z7982 Long term (current) use of aspirin: Secondary | ICD-10-CM | POA: Insufficient documentation

## 2012-11-04 DIAGNOSIS — Z8669 Personal history of other diseases of the nervous system and sense organs: Secondary | ICD-10-CM | POA: Insufficient documentation

## 2012-11-04 DIAGNOSIS — W19XXXA Unspecified fall, initial encounter: Secondary | ICD-10-CM

## 2012-11-04 DIAGNOSIS — G309 Alzheimer's disease, unspecified: Secondary | ICD-10-CM | POA: Insufficient documentation

## 2012-11-04 DIAGNOSIS — N390 Urinary tract infection, site not specified: Secondary | ICD-10-CM

## 2012-11-04 DIAGNOSIS — Y9210 Unspecified residential institution as the place of occurrence of the external cause: Secondary | ICD-10-CM | POA: Insufficient documentation

## 2012-11-04 DIAGNOSIS — E785 Hyperlipidemia, unspecified: Secondary | ICD-10-CM | POA: Insufficient documentation

## 2012-11-04 DIAGNOSIS — S0990XA Unspecified injury of head, initial encounter: Secondary | ICD-10-CM | POA: Insufficient documentation

## 2012-11-04 LAB — URINALYSIS, ROUTINE W REFLEX MICROSCOPIC
Nitrite: NEGATIVE
Protein, ur: NEGATIVE mg/dL
Urobilinogen, UA: 1 mg/dL (ref 0.0–1.0)

## 2012-11-04 LAB — CBC WITH DIFFERENTIAL/PLATELET
Basophils Absolute: 0 10*3/uL (ref 0.0–0.1)
Basophils Relative: 0 % (ref 0–1)
Eosinophils Absolute: 0.1 10*3/uL (ref 0.0–0.7)
Eosinophils Relative: 3 % (ref 0–5)
MCH: 32 pg (ref 26.0–34.0)
MCV: 95.7 fL (ref 78.0–100.0)
Neutrophils Relative %: 66 % (ref 43–77)
Platelets: 152 10*3/uL (ref 150–400)
RDW: 13.9 % (ref 11.5–15.5)

## 2012-11-04 LAB — URINE MICROSCOPIC-ADD ON

## 2012-11-04 LAB — BASIC METABOLIC PANEL
Calcium: 8.9 mg/dL (ref 8.4–10.5)
GFR calc Af Amer: >90 mL/min (ref >90)
GFR calc non Af Amer: 80 mL/min — ABNORMAL LOW (ref >90)
Potassium: 3.6 mEq/L (ref 3.5–5.1)
Sodium: 139 mEq/L (ref 135–145)

## 2012-11-04 MED ORDER — ZIPRASIDONE MESYLATE 20 MG IM SOLR
10.0000 mg | Freq: Once | INTRAMUSCULAR | Status: AC
  Administered 2012-11-04: 10 mg via INTRAMUSCULAR

## 2012-11-04 MED ORDER — ZIPRASIDONE MESYLATE 20 MG IM SOLR
INTRAMUSCULAR | Status: AC
  Filled 2012-11-04: qty 20

## 2012-11-04 MED ORDER — LORAZEPAM 2 MG/ML IJ SOLN
INTRAMUSCULAR | Status: AC
  Filled 2012-11-04: qty 1

## 2012-11-04 MED ORDER — CEPHALEXIN 250 MG PO CAPS: 250.0000 mg | ORAL_CAPSULE | Freq: Four times a day (QID) | ORAL | Status: DC

## 2012-11-04 MED ORDER — LORAZEPAM 2 MG/ML IJ SOLN
2.0000 mg | Freq: Once | INTRAMUSCULAR | Status: AC
  Administered 2012-11-04: 2 mg via INTRAMUSCULAR

## 2012-11-04 NOTE — ED Notes (Signed)
WUJ:[id number]> Expected date:<BR> Expected time:<BR> Means of arrival:<BR> Comments:<BR> Fall, elderly, head lac, no thinners

## 2012-11-04 NOTE — ED Notes (Signed)
Patient transported to CT 

## 2012-11-04 NOTE — ED Notes (Signed)
Per EMS fell out of wheelchair hit center back of head (dry blood noted) -LOC. Patient is bedbound.

## 2012-11-04 NOTE — ED Provider Notes (Signed)
History     CSN: [id number]  Arrival date & time 11/04/12  1250   None     Chief Complaint  Patient presents with  . Fall    (Consider location/radiation/quality/duration/timing/severity/associated sxs/prior treatment) HPI Comments: David Vaughan is a 78 y.o. Male who fell at his nursing home. Circumstances of the fall are not clear. He apparently was sitting in his wheelchair, when he fell. The patient is unable to give history. The patient has been in his SNF, for 3 weeks, since leaving the geriatric psychiatric facility, where he was placed about 2 months ago. The patient's wife is with him in the ER. She has seen him agitated. Like this, before.   Level V Caveat- Dementia  Patient is a 78 y.o. male presenting with fall. The history is provided by the patient.  Fall    Past Medical History  Diagnosis Date  . Hypertension   . Hyperlipidemia   . Alzheimer disease   . Dementia   . Neuropathy of lower extremity     Past Surgical History  Procedure Date  . Hernia repair   . Eye surgery     History reviewed. No pertinent family history.  History  Substance Use Topics  . Smoking status: Former Games developer  . Smokeless tobacco: Not on file  . Alcohol Use: No      Review of Systems  Unable to perform ROS   Allergies  Codeine and Iohexol  Home Medications   Current Outpatient Rx  Name  Route  Sig  Dispense  Refill  . ASPIRIN 325 MG PO TBEC   Oral   Take 325 mg by mouth every morning.          Marland Kitchen CALCIUM CARBONATE 600 MG PO TABS   Oral   Take 600 mg by mouth every morning.         Marland Kitchen DIVALPROEX SODIUM ER 250 MG PO TB24   Oral   Take 250 mg by mouth 2 (two) times daily.         . DONEPEZIL HCL 10 MG PO TABS   Oral   Take 10 mg by mouth every morning.          Marland Kitchen DUTASTERIDE 0.5 MG PO CAPS   Oral   Take 0.5 mg by mouth every morning.          Marland Kitchen ESCITALOPRAM OXALATE 10 MG PO TABS   Oral   Take 10 mg by mouth every morning.         Marland Kitchen  OMEGA-3 FATTY ACIDS 1000 MG PO CAPS   Oral   Take 2 g by mouth every morning.          Marland Kitchen GABAPENTIN 300 MG PO CAPS   Oral   Take 300 mg by mouth at bedtime.         . GUAIFENESIN 100 MG/5ML PO LIQD   Oral   Take 200 mg by mouth every 6 (six) hours as needed. For cough         . GUAIFENESIN ER 600 MG PO TB12   Oral   Take 1,200 mg by mouth 2 (two) times daily. For 7 days, started 12/11         . HALOPERIDOL 0.5 MG PO TABS   Oral   Take 0.5 mg by mouth 2 (two) times daily.         Marland Kitchen HALOPERIDOL 1 MG PO TABS   Oral   Take 1 mg by mouth  at bedtime.         Marland Kitchen LORAZEPAM 0.5 MG PO TABS   Oral   Take 0.25 mg by mouth at bedtime.         Marland Kitchen LORAZEPAM 2 MG/ML PO CONC   Oral   Take 2 mg by mouth 3 (three) times daily as needed. For agitation         . ADULT MULTIVITAMIN W/MINERALS CH   Oral   Take 1 tablet by mouth every morning.         Marland Kitchen PRAVASTATIN SODIUM 20 MG PO TABS   Oral   Take 20 mg by mouth every morning.          Marland Kitchen RISPERIDONE 0.5 MG PO TABS   Oral   Take 0.5 mg by mouth 2 (two) times daily.         Marland Kitchen VALPROIC ACID 250 MG/5ML PO SYRP   Oral   Take 250 mg by mouth 2 (two) times daily with a meal.         . CEPHALEXIN 250 MG PO CAPS   Oral   Take 1 capsule (250 mg total) by mouth 4 (four) times daily.   28 capsule   0     BP 113/67  Pulse 76  Temp 98 F (36.7 C) (Axillary)  Resp 18  SpO2 97%  Physical Exam  Nursing note and vitals reviewed. Constitutional: He appears well-developed. He appears distressed (Restless, trying to get out of bed).       Elderly, frail  HENT:  Head: Normocephalic.  Right Ear: External ear normal.  Left Ear: External ear normal.       Contusion and small abrasion, mid occiput, no deformity or crepitation  Eyes: Conjunctivae normal and EOM are normal. Pupils are equal, round, and reactive to light.  Neck: Normal range of motion and phonation normal. Neck supple.  Cardiovascular: Normal rate,  regular rhythm, normal heart sounds and intact distal pulses.   Pulmonary/Chest: Effort normal and breath sounds normal. He exhibits no bony tenderness.  Abdominal: Soft. Normal appearance. There is no tenderness.  Musculoskeletal: Normal range of motion. He exhibits edema (Bilateral lower legs).  Neurological: He is alert. He has normal strength. No cranial nerve deficit or sensory deficit. He exhibits normal muscle tone. Coordination normal.  Skin: Skin is warm, dry and intact.  Psychiatric:       Agitated, fights, pushes away. Threatening to examiner, and nurse.    ED Course  Procedures (including critical care time)  Patient treated for agitation, and safety with Geodon, and Ativan, IM.  Patient was manageable after treatment with medications.    17:00- Serial Vital signs are normal.  CRITICAL CARE Performed by: David Vaughan   Total critical care time:35 minutes  Critical care time was exclusive of separately billable procedures and treating other patients.  Critical care was necessary to treat or prevent imminent or life-threatening deterioration.  Critical care was time spent personally by me on the following activities: development of treatment plan with patient and/or surrogate as well as nursing, discussions with consultants, evaluation of patient's response to treatment, examination of patient, obtaining history from patient or surrogate, ordering and performing treatments and interventions, ordering and review of laboratory studies, ordering and review of radiographic studies, pulse oximetry and re-evaluation of patient's condition.  Labs Reviewed  CBC WITH DIFFERENTIAL - Abnormal; Notable for the following:    RBC 4.19 (*)     All other components within normal limits  BASIC METABOLIC PANEL - Abnormal; Notable for the following:    BUN 27 (*)     GFR calc non Af Amer 80 (*)     All other components within normal limits  URINALYSIS, ROUTINE W REFLEX MICROSCOPIC -  Abnormal; Notable for the following:    APPearance CLOUDY (*)     Hgb urine dipstick MODERATE (*)     Ketones, ur TRACE (*)     Leukocytes, UA LARGE (*)     All other components within normal limits  URINE MICROSCOPIC-ADD ON - Abnormal; Notable for the following:    Squamous Epithelial / LPF FEW (*)     Bacteria, UA FEW (*)     All other components within normal limits  URINE CULTURE   Dg Chest 1 View  11/04/2012  *RADIOLOGY REPORT*  Clinical Data: Cough and congestion  CHEST - 1 VIEW  Comparison: 09/24/2012  Findings: Low volume film. The cardiopericardial silhouette is enlarged. Interstitial markings are diffusely coarsened with chronic features.  No focal airspace consolidation or overt pulmonary edema. Imaged bony structures of the thorax are intact. Telemetry leads overlie the chest.  IMPRESSION: Cardiomegaly with underlying chronic interstitial coarsening.  No overt pulmonary edema or focal airspace consolidation.   Original Report Authenticated By: David Vaughan, M.D.    Ct Head Wo Contrast  11/04/2012  *RADIOLOGY REPORT*  Clinical Data:  Fall  CT HEAD WITHOUT CONTRAST CT CERVICAL SPINE WITHOUT CONTRAST  Technique:  Multidetector CT imaging of the head and cervical spine was performed following the standard protocol without intravenous contrast.  Multiplanar CT image reconstructions of the cervical spine were also generated.  Comparison:  CT 09/24/2014  CT HEAD  Findings: Advanced atrophy.  Chronic microvascular ischemic changes in the white matter.  Negative for acute infarct.  Negative for hemorrhage or mass lesion.  Negative for skull fracture.  IMPRESSION: Atrophy and chronic ischemic change.  No acute abnormality.  CT CERVICAL SPINE  Findings: Negative for cervical spine fracture.  Disc degeneration and facet degeneration are present throughout the cervical spine without significant spinal stenosis.  Carotid artery calcification bilaterally.  IMPRESSION: Negative for cervical spine  fracture.   Original Report Authenticated By: David Vaughan, M.D.    Ct Cervical Spine Wo Contrast  11/04/2012  *RADIOLOGY REPORT*  Clinical Data:  Fall  CT HEAD WITHOUT CONTRAST CT CERVICAL SPINE WITHOUT CONTRAST  Technique:  Multidetector CT imaging of the head and cervical spine was performed following the standard protocol without intravenous contrast.  Multiplanar CT image reconstructions of the cervical spine were also generated.  Comparison:  CT 09/24/2014  CT HEAD  Findings: Advanced atrophy.  Chronic microvascular ischemic changes in the white matter.  Negative for acute infarct.  Negative for hemorrhage or mass lesion.  Negative for skull fracture.  IMPRESSION: Atrophy and chronic ischemic change.  No acute abnormality.  CT CERVICAL SPINE  Findings: Negative for cervical spine fracture.  Disc degeneration and facet degeneration are present throughout the cervical spine without significant spinal stenosis.  Carotid artery calcification bilaterally.  IMPRESSION: Negative for cervical spine fracture.   Original Report Authenticated By: David Vaughan, M.D.    Nursing notes, applicable records and vitals reviewed.  Radiologic Images/Reports reviewed.   1. Fall   2. Head injury   3. UTI (lower urinary tract infection)       MDM  Fall, cause not clear. Apparent urinary tract infection. Culture has been ordered. No evidence for serious intracranial injury. Doubt metabolic instability, serious bacterial infection  or impending vascular collapse; the patient is stable for discharge.   Plan: Home Medications- Keflex; Home Treatments- rest; Recommended follow up- PCP in 1 week, sooner if needed.       David Melter, MD 11/04/12 [phone]

## 2012-11-06 LAB — URINE CULTURE

## 2012-11-07 NOTE — ED Notes (Signed)
+  Urine. Patient given Keflex. No sensitivities listed. Chart sent to EDP office for review. °

## 2012-11-27 ENCOUNTER — Emergency Department (HOSPITAL_COMMUNITY)
Admission: EM | Admit: 2012-11-27 | Discharge: 2012-11-27 | Disposition: A | Discharge status: Skilled Nursing Facility | Payer: Medicare Other | Attending: Emergency Medicine | Admitting: Emergency Medicine

## 2012-11-27 ENCOUNTER — Encounter (HOSPITAL_COMMUNITY): Payer: Self-pay | Admitting: Emergency Medicine

## 2012-11-27 ENCOUNTER — Emergency Department (HOSPITAL_COMMUNITY): Payer: Medicare Other

## 2012-11-27 DIAGNOSIS — F0280 Dementia in other diseases classified elsewhere without behavioral disturbance: Secondary | ICD-10-CM | POA: Insufficient documentation

## 2012-11-27 DIAGNOSIS — R369 Urethral discharge, unspecified: Secondary | ICD-10-CM | POA: Insufficient documentation

## 2012-11-27 DIAGNOSIS — N390 Urinary tract infection, site not specified: Secondary | ICD-10-CM

## 2012-11-27 DIAGNOSIS — Z87891 Personal history of nicotine dependence: Secondary | ICD-10-CM | POA: Insufficient documentation

## 2012-11-27 DIAGNOSIS — E785 Hyperlipidemia, unspecified: Secondary | ICD-10-CM | POA: Insufficient documentation

## 2012-11-27 DIAGNOSIS — Z8669 Personal history of other diseases of the nervous system and sense organs: Secondary | ICD-10-CM | POA: Insufficient documentation

## 2012-11-27 DIAGNOSIS — R21 Rash and other nonspecific skin eruption: Secondary | ICD-10-CM | POA: Insufficient documentation

## 2012-11-27 DIAGNOSIS — Z79899 Other long term (current) drug therapy: Secondary | ICD-10-CM | POA: Insufficient documentation

## 2012-11-27 DIAGNOSIS — Z7901 Long term (current) use of anticoagulants: Secondary | ICD-10-CM | POA: Insufficient documentation

## 2012-11-27 DIAGNOSIS — I10 Essential (primary) hypertension: Secondary | ICD-10-CM | POA: Insufficient documentation

## 2012-11-27 DIAGNOSIS — G309 Alzheimer's disease, unspecified: Secondary | ICD-10-CM | POA: Insufficient documentation

## 2012-11-27 DIAGNOSIS — R4182 Altered mental status, unspecified: Secondary | ICD-10-CM

## 2012-11-27 LAB — CBC WITH DIFFERENTIAL/PLATELET
Basophils Absolute: 0 K/uL (ref 0.0–0.1)
Basophils Relative: 0 % (ref 0–1)
Eosinophils Absolute: 0 10*3/uL (ref 0.0–0.7)
Eosinophils Relative: 0 % (ref 0–5)
HCT: 41.3 % (ref 39.0–52.0)
Hemoglobin: 14.1 g/dL (ref 13.0–17.0)
Lymphocytes Relative: 8 % — ABNORMAL LOW (ref 12–46)
Lymphs Abs: 0.9 10*3/uL (ref 0.7–4.0)
MCH: 32.3 pg (ref 26.0–34.0)
MCHC: 34.1 g/dL (ref 30.0–36.0)
MCV: 94.5 fL (ref 78.0–100.0)
Monocytes Absolute: 0.9 K/uL (ref 0.1–1.0)
Monocytes Relative: 9 % (ref 3–12)
Neutro Abs: 8.4 K/uL — ABNORMAL HIGH (ref 1.7–7.7)
Neutrophils Relative %: 82 % — ABNORMAL HIGH (ref 43–77)
Platelets: 211 10*3/uL (ref 150–400)
RBC: 4.37 MIL/uL (ref 4.22–5.81)
RDW: 13.4 % (ref 11.5–15.5)
WBC: 10.2 K/uL (ref 4.0–10.5)

## 2012-11-27 LAB — BASIC METABOLIC PANEL
GFR calc Af Amer: >90 mL/min (ref >90)
GFR calc non Af Amer: 85 mL/min — ABNORMAL LOW (ref >90)
Potassium: 3.7 mEq/L (ref 3.5–5.1)
Sodium: 137 mEq/L (ref 135–145)

## 2012-11-27 LAB — URINALYSIS, ROUTINE W REFLEX MICROSCOPIC
Glucose, UA: NEGATIVE mg/dL
Hgb urine dipstick: NEGATIVE
Ketones, ur: 40 mg/dL — AB
Leukocytes, UA: NEGATIVE
Nitrite: NEGATIVE
Protein, ur: NEGATIVE mg/dL
Specific Gravity, Urine: 1.03 (ref 1.005–1.030)
Urobilinogen, UA: 1 mg/dL (ref 0.0–1.0)
pH: 6 (ref 5.0–8.0)

## 2012-11-27 LAB — BASIC METABOLIC PANEL WITH GFR
BUN: 22 mg/dL (ref 6–23)
CO2: 28 meq/L (ref 19–32)
Calcium: 8.9 mg/dL (ref 8.4–10.5)
Chloride: 98 meq/L (ref 96–112)
Creatinine, Ser: 0.7 mg/dL (ref 0.50–1.35)
Glucose, Bld: 108 mg/dL — ABNORMAL HIGH (ref 70–99)

## 2012-11-27 MED ORDER — SODIUM CHLORIDE 0.9 % IV BOLUS (SEPSIS)
500.0000 mL | Freq: Once | INTRAVENOUS | Status: AC
  Administered 2012-11-27: 500 mL via INTRAVENOUS

## 2012-11-27 MED ORDER — FENTANYL CITRATE 0.05 MG/ML IJ SOLN
50.0000 ug | Freq: Once | INTRAMUSCULAR | Status: AC
  Administered 2012-11-27: 50 ug via INTRAVENOUS
  Filled 2012-11-27: qty 2

## 2012-11-27 MED ORDER — CEPHALEXIN 500 MG PO CAPS: 500.0000 mg | ORAL_CAPSULE | Freq: Three times a day (TID) | ORAL | Status: DC

## 2012-11-27 MED ORDER — DEXTROSE 5 % IV SOLN
1.0000 g | Freq: Once | INTRAVENOUS | Status: AC
  Administered 2012-11-27: 1 g via INTRAVENOUS
  Filled 2012-11-27: qty 10

## 2012-11-27 MED ORDER — ACETAMINOPHEN 650 MG RE SUPP
650.0000 mg | Freq: Once | RECTAL | Status: AC
  Administered 2012-11-27: 650 mg via RECTAL
  Filled 2012-11-27: qty 1

## 2012-11-27 MED ORDER — ACETAMINOPHEN 325 MG PO TABS: 650.0000 mg | ORAL_TABLET | Freq: Once | ORAL | Status: DC

## 2012-11-27 MED ORDER — SODIUM CHLORIDE 0.9 % IV BOLUS (SEPSIS)
1000.0000 mL | Freq: Once | INTRAVENOUS | Status: AC
  Administered 2012-11-27: 1000 mL via INTRAVENOUS

## 2012-11-27 NOTE — Discharge Instructions (Signed)
Urinary Tract Infection Urinary tract infections (UTIs) can develop anywhere along your urinary tract. Your urinary tract is your body's drainage system for removing wastes and extra water. Your urinary tract includes two kidneys, two ureters, a bladder, and a urethra. Your kidneys are a pair of bean-shaped organs. Each kidney is about the size of your fist. They are located below your ribs, one on each side of your spine. CAUSES Infections are caused by microbes, which are microscopic organisms, including fungi, viruses, and bacteria. These organisms are so small that they can only be seen through a microscope. Bacteria are the microbes that most commonly cause UTIs. SYMPTOMS  Symptoms of UTIs may vary by age and gender of the patient and by the location of the infection. Symptoms in young women typically include a frequent and intense urge to urinate and a painful, burning feeling in the bladder or urethra during urination. Older women and men are more likely to be tired, shaky, and weak and have muscle aches and abdominal pain. A fever may mean the infection is in your kidneys. Other symptoms of a kidney infection include pain in your back or sides below the ribs, nausea, and vomiting. DIAGNOSIS To diagnose a UTI, your caregiver will ask you about your symptoms. Your caregiver also will ask to provide a urine sample. The urine sample will be tested for bacteria and white blood cells. White blood cells are made by your body to help fight infection. TREATMENT  Typically, UTIs can be treated with medication. Because most UTIs are caused by a bacterial infection, they usually can be treated with the use of antibiotics. The choice of antibiotic and length of treatment depend on your symptoms and the type of bacteria causing your infection. HOME CARE INSTRUCTIONS  If you were prescribed antibiotics, take them exactly as your caregiver instructs you. Finish the medication even if you feel better after you  have only taken some of the medication.  Drink enough water and fluids to keep your urine clear or pale yellow.  Avoid caffeine, tea, and carbonated beverages. They tend to irritate your bladder.  Empty your bladder often. Avoid holding urine for long periods of time.  Empty your bladder before and after sexual intercourse.  After a bowel movement, women should cleanse from front to back. Use each tissue only once. SEEK MEDICAL CARE IF:   You have back pain.  You develop a fever.  Your symptoms do not begin to resolve within 3 days. SEEK IMMEDIATE MEDICAL CARE IF:   You have severe back pain or lower abdominal pain.  You develop chills.  You have nausea or vomiting.  You have continued burning or discomfort with urination. MAKE SURE YOU:   Understand these instructions.  Will watch your condition.  Will get help right away if you are not doing well or get worse. Document Released: 08/19/2005 Document Revised: 05/10/2012 Document Reviewed: 12/18/2011 [employer] Patient Information 2013 [employer].  

## 2012-11-27 NOTE — ED Notes (Signed)
EAV:[id number]> Expected date:11/27/12<BR> Expected time:12:54 PM<BR> Means of arrival:Ambulance<BR> Comments:<BR> nsg home pt, lethargic

## 2012-11-27 NOTE — ED Notes (Signed)
Per EMS: Pt is from [facility]. Facility reports that patient been slumped over his wheelchair over the past few days, which is not normal. Pt has a baseline of alzheimer's and is normally non-verbal, however normally looks around room.   Facility reports thick yellow penile discharge and painful grimaces while being changed.

## 2012-11-27 NOTE — ED Notes (Signed)
[facility] called to take pt back to [facility].  

## 2012-11-27 NOTE — ED Notes (Signed)
[facility] in room, taking pt back to nrsg home, family aware

## 2012-11-27 NOTE — ED Provider Notes (Addendum)
History     CSN: [id number]  Arrival date & time 11/27/12  1254   First MD Initiated Contact with Patient 11/27/12 1256      Chief Complaint  Patient presents with  . Fatigue  . Penile Discharge    (Consider location/radiation/quality/duration/timing/severity/associated sxs/prior treatment) HPI Comments: Level 5 caveat due to dementia, aphasia.  Pt is sent from nursing facility due to decrease in energy for last few days, less interactive, drowsy and restless.  No following commands, not tracking people and attentive like he normally is.  Patient is reportedly more lethargic and less interactive. He does have a DO NOT RESUSCITATE sheets accompanying him. Facility reports that he has had a yellowish, purulent penile discharge. He seems to be behaving as though he were in pain when he is changed.  Patient is a 79 y.o. male presenting with penile discharge. The history is provided by the EMS personnel and medical records. The history is limited by the condition of the patient.  Penile Discharge    Past Medical History  Diagnosis Date  . Hypertension   . Hyperlipidemia   . Alzheimer disease   . Dementia   . Neuropathy of lower extremity     Past Surgical History  Procedure Date  . Hernia repair   . Eye surgery     History reviewed. No pertinent family history.  History  Substance Use Topics  . Smoking status: Former Games developer  . Smokeless tobacco: Not on file  . Alcohol Use: No      Review of Systems  Unable to perform ROS: Dementia  Gastrointestinal: Negative for vomiting.  Genitourinary: Positive for discharge.  Skin: Positive for rash.  Psychiatric/Behavioral: Positive for confusion.    Allergies  Codeine and Iohexol  Home Medications   Current Outpatient Rx  Name  Route  Sig  Dispense  Refill  . ACETAMINOPHEN 500 MG PO TABS   Oral   Take 500 mg by mouth every 6 (six) hours as needed. Pain         . ALUM & MAG HYDROXIDE-SIMETH 200-200-20 MG/5ML PO  SUSP   Oral   Take 30 mLs by mouth every 6 (six) hours as needed. indigestion         . ASPIRIN 325 MG PO TBEC   Oral   Take 325 mg by mouth every morning.          Marland Kitchen CALCIUM CARBONATE 600 MG PO TABS   Oral   Take 600 mg by mouth every morning.         Marland Kitchen DIVALPROEX SODIUM ER 250 MG PO TB24   Oral   Take 250 mg by mouth 2 (two) times daily.         . DONEPEZIL HCL 10 MG PO TABS   Oral   Take 10 mg by mouth every morning.          Marland Kitchen DUTASTERIDE 0.5 MG PO CAPS   Oral   Take 0.5 mg by mouth every morning.          Marland Kitchen ESCITALOPRAM OXALATE 10 MG PO TABS   Oral   Take 10 mg by mouth every morning.         Marland Kitchen OMEGA-3 FATTY ACIDS 1000 MG PO CAPS   Oral   Take 2 g by mouth every morning.          . FUROSEMIDE 20 MG PO TABS   Oral   Take 20 mg by mouth daily.         Marland Kitchen  GABAPENTIN 300 MG PO CAPS   Oral   Take 300 mg by mouth at bedtime.         Marland Kitchen HALOPERIDOL 0.5 MG PO TABS   Oral   Take 0.5 mg by mouth 2 (two) times daily.         Marland Kitchen LOPERAMIDE HCL 2 MG PO CAPS   Oral   Take 2 mg by mouth 4 (four) times daily as needed. diarrhea         . LORAZEPAM 0.5 MG PO TABS   Oral   Take 0.25 mg by mouth every 6 (six) hours as needed. anxiety         . ADULT MULTIVITAMIN W/MINERALS CH   Oral   Take 1 tablet by mouth every morning.         Marland Kitchen POTASSIUM CHLORIDE CRYS ER 10 MEQ PO TBCR   Oral   Take 10 mEq by mouth daily.         Marland Kitchen PRAVASTATIN SODIUM 20 MG PO TABS   Oral   Take 20 mg by mouth every morning.          Marland Kitchen RISPERIDONE 0.5 MG PO TABS   Oral   Take 0.5 mg by mouth 2 (two) times daily.         Marland Kitchen VALPROIC ACID 250 MG/5ML PO SYRP   Oral   Take 250 mg by mouth 2 (two) times daily with a meal.         . CEPHALEXIN 500 MG PO CAPS   Oral   Take 1 capsule (500 mg total) by mouth 3 (three) times daily.   30 capsule   0   . GUAIFENESIN 100 MG/5ML PO LIQD   Oral   Take 200 mg by mouth every 6 (six) hours as needed. For cough          . LORAZEPAM 2 MG/ML PO CONC   Oral   Take 2 mg by mouth 3 (three) times daily as needed. For agitation           BP 114/64  Pulse 79  Temp 100.4 F (38 C) (Rectal)  Resp 20  SpO2 100%  Physical Exam  Nursing note and vitals reviewed. Constitutional: He appears well-developed and well-nourished.  HENT:  Head: Normocephalic and atraumatic.  Mouth/Throat: Uvula is midline. Mucous membranes are dry.  Eyes: EOM are normal.  Neck: Neck supple.  Cardiovascular: Normal rate.   Pulmonary/Chest: Effort normal.  Abdominal: Soft. He exhibits no distension. There is no tenderness. There is no rebound. Hernia confirmed negative in the right inguinal area and confirmed negative in the left inguinal area.  Genitourinary: Uncircumcised. Penile erythema and penile tenderness present. No phimosis or paraphimosis. No discharge found.  Neurological: He is alert.  Skin: Skin is warm.    ED Course  Procedures (including critical care time)  Labs Reviewed  CBC WITH DIFFERENTIAL - Abnormal; Notable for the following:    Neutrophils Relative 82 (*)     Neutro Abs 8.4 (*)     Lymphocytes Relative 8 (*)     All other components within normal limits  BASIC METABOLIC PANEL - Abnormal; Notable for the following:    Glucose, Bld 108 (*)     GFR calc non Af Amer 85 (*)     All other components within normal limits  URINALYSIS, ROUTINE W REFLEX MICROSCOPIC - Abnormal; Notable for the following:    Color, Urine AMBER (*)  BIOCHEMICALS MAY BE AFFECTED BY  COLOR   Bilirubin Urine MODERATE (*)     Ketones, ur 40 (*)     All other components within normal limits  URINE CULTURE  CULTURE, BLOOD (ROUTINE X 2)  CULTURE, BLOOD (ROUTINE X 2)   Dg Chest Port 1 View  11/27/2012  *RADIOLOGY REPORT*  Clinical Data: Chest pain  PORTABLE CHEST - 1 VIEW  Comparison: 11/04/2012  Findings: Diffusely increased interstitial lung markings noted, without focal pulmonary opacity.  Stable bilateral lower lobe scarring  or atelectasis.  No new focal pulmonary opacity.  Heart size is normal.  No pleural effusion.  No acute osseous finding.  IMPRESSION: Stable bibasilar atelectasis or scarring.  No new acute focal abnormality.   Original Report Authenticated By: Christiana Pellant, M.D.      1. Urinary tract infection   2. Altered mental state     ra sat is 97% and I interpret to be normal  Dr. Wylene Simmer is no longer pt's PCP.  Pt will be given 1 g of IV rocephin for presumtive UTI given purulent drainage by report.  Will give Rx for keflex.  Pt can be rechecked by facility [facility] tomorrow for a rehceck  MDM  Pt is awake, will rouse with stimulation.  Pt with low grade fever, stage 1-2 sacral ulcer.  Penis is irritated.  UA does not show definitive infection.  PCXR shows no infiltrates per radiologist.  Pt is receiving IVF's. Pt has normal HR and BP here. He is a DNR.  Will discuss with Dr. Wylene Simmer.          Gavin Pound. Oletta Lamas, MD 11/27/12 1644  Gavin Pound. Shaya Reddick, MD 11/27/12 1743

## 2012-11-28 LAB — URINE CULTURE
Colony Count: NO GROWTH
Culture: NO GROWTH

## 2012-12-03 LAB — CULTURE, BLOOD (ROUTINE X 2)
Culture: NO GROWTH
Culture: NO GROWTH

## 2013-07-01 ENCOUNTER — Encounter (HOSPITAL_COMMUNITY): Payer: Self-pay | Admitting: Emergency Medicine

## 2013-07-01 ENCOUNTER — Emergency Department (HOSPITAL_COMMUNITY)
Admission: EM | Admit: 2013-07-01 | Discharge: 2013-07-01 | Disposition: A | Discharge status: Home / Self Care | Attending: Emergency Medicine | Admitting: Emergency Medicine

## 2013-07-01 ENCOUNTER — Emergency Department (HOSPITAL_COMMUNITY)

## 2013-07-01 DIAGNOSIS — F0390 Unspecified dementia without behavioral disturbance: Secondary | ICD-10-CM | POA: Insufficient documentation

## 2013-07-01 DIAGNOSIS — G309 Alzheimer's disease, unspecified: Secondary | ICD-10-CM | POA: Insufficient documentation

## 2013-07-01 DIAGNOSIS — Y9210 Unspecified residential institution as the place of occurrence of the external cause: Secondary | ICD-10-CM | POA: Insufficient documentation

## 2013-07-01 DIAGNOSIS — S0180XA Unspecified open wound of other part of head, initial encounter: Secondary | ICD-10-CM | POA: Insufficient documentation

## 2013-07-01 DIAGNOSIS — Z862 Personal history of diseases of the blood and blood-forming organs and certain disorders involving the immune mechanism: Secondary | ICD-10-CM | POA: Insufficient documentation

## 2013-07-01 DIAGNOSIS — Z87891 Personal history of nicotine dependence: Secondary | ICD-10-CM | POA: Insufficient documentation

## 2013-07-01 DIAGNOSIS — W19XXXA Unspecified fall, initial encounter: Secondary | ICD-10-CM

## 2013-07-01 DIAGNOSIS — Z23 Encounter for immunization: Secondary | ICD-10-CM | POA: Insufficient documentation

## 2013-07-01 DIAGNOSIS — W050XXA Fall from non-moving wheelchair, initial encounter: Secondary | ICD-10-CM | POA: Insufficient documentation

## 2013-07-01 DIAGNOSIS — G1229 Other motor neuron disease: Secondary | ICD-10-CM | POA: Insufficient documentation

## 2013-07-01 DIAGNOSIS — Y939 Activity, unspecified: Secondary | ICD-10-CM | POA: Insufficient documentation

## 2013-07-01 DIAGNOSIS — Z79899 Other long term (current) drug therapy: Secondary | ICD-10-CM | POA: Insufficient documentation

## 2013-07-01 DIAGNOSIS — Z8639 Personal history of other endocrine, nutritional and metabolic disease: Secondary | ICD-10-CM | POA: Insufficient documentation

## 2013-07-01 DIAGNOSIS — F0280 Dementia in other diseases classified elsewhere without behavioral disturbance: Secondary | ICD-10-CM | POA: Insufficient documentation

## 2013-07-01 DIAGNOSIS — I10 Essential (primary) hypertension: Secondary | ICD-10-CM | POA: Insufficient documentation

## 2013-07-01 DIAGNOSIS — S0181XA Laceration without foreign body of other part of head, initial encounter: Secondary | ICD-10-CM

## 2013-07-01 DIAGNOSIS — Z7982 Long term (current) use of aspirin: Secondary | ICD-10-CM | POA: Insufficient documentation

## 2013-07-01 MED ORDER — TETANUS-DIPHTH-ACELL PERTUSSIS 5-2.5-18.5 LF-MCG/0.5 IM SUSP
0.5000 mL | Freq: Once | INTRAMUSCULAR | Status: AC
  Administered 2013-07-01: 0.5 mL via INTRAMUSCULAR
  Filled 2013-07-01: qty 0.5

## 2013-07-01 NOTE — ED Notes (Addendum)
Per [facility] pt from [facility], ems reports pt slipped forward out of his wheelchair. EMS denies LOC or hx of blood thinners. Pt has hx of alzheimers and mild confusion which nursing home reports is pt baseline. Pt is also quadriplegic. EMS reports pt has 2 inch laceration to left eyebrow, bleeding is controlled.

## 2013-07-01 NOTE — ED Notes (Signed)
Pts wife called, upset stating blood was left in the patients hair and some on his face from fall. "I respect [facility] and this is un call for" "You go raise HELL at those nurses." " I have hit the ceiling of hell with him coming back like this." "We have been married 63 years and I have never seen anything like this." Pt concerned about pt having a swollen nose. Reassured he was checked for injuries. Wife much calmer after venting. Stated "put my number on his chart [phone] and let them know I am mad as hell."

## 2013-07-01 NOTE — ED Provider Notes (Signed)
CSN: [id number]     Arrival date & time 07/01/13  1310 History     First MD Initiated Contact with Patient 07/01/13 1315     Chief Complaint  Patient presents with  . Fall   (Consider location/radiation/quality/duration/timing/severity/associated sxs/prior Treatment) HPI Comments: Pt was seen leaning forward then falling out of wheelchair onto ground.  No known LOC, no recent illness, no change in mental status, no reported neuro complaints.  Pt found to have L brow laceration  Patient is a 79 y.o. male presenting with fall. The history is provided by the nursing home and the EMS personnel. No language interpreter was used.  Fall This is a new problem. The current episode started less than 1 hour ago. The problem occurs constantly. The problem has not changed since onset.Pertinent negatives include no chest pain, no abdominal pain and no shortness of breath. Associated symptoms comments: None known. Nothing aggravates the symptoms. Nothing relieves the symptoms. He has tried nothing for the symptoms. The treatment provided no relief.    Past Medical History  Diagnosis Date  . Hypertension   . Hyperlipidemia   . Alzheimer disease   . Dementia   . Neuropathy of lower extremity    Past Surgical History  Procedure Laterality Date  . Hernia repair    . Eye surgery     History reviewed. No pertinent family history. History  Substance Use Topics  . Smoking status: Former Games developer  . Smokeless tobacco: Not on file  . Alcohol Use: No    Review of Systems  Constitutional: Negative for fever, activity change, appetite change and fatigue.       ROS obtained by phone from SNF   HENT: Negative for facial swelling and trouble swallowing.   Eyes: Negative for photophobia and pain.  Respiratory: Negative for cough, chest tightness and shortness of breath.   Cardiovascular: Negative for chest pain.  Gastrointestinal: Negative for vomiting, abdominal pain and diarrhea.  Endocrine: Negative  for polydipsia and polyuria.  Genitourinary: Negative for dysuria, urgency, decreased urine volume and difficulty urinating.  Musculoskeletal: Negative for back pain and gait problem.  Skin: Positive for wound. Negative for color change and rash.  Allergic/Immunologic: Negative for immunocompromised state.  Neurological: Negative for facial asymmetry and weakness.  Psychiatric/Behavioral: Negative for confusion.    Allergies  Codeine and Iohexol  Home Medications   Current Outpatient Rx  Name  Route  Sig  Dispense  Refill  . acetaminophen (TYLENOL) 500 MG tablet   Oral   Take 500 mg by mouth every 6 (six) hours as needed. Pain         . alum & mag hydroxide-simeth (MAALOX/MYLANTA) 200-200-20 MG/5ML suspension   Oral   Take 30 mLs by mouth every 6 (six) hours as needed. indigestion         . aspirin 325 MG EC tablet   Oral   Take 325 mg by mouth every morning.          . calcium carbonate (OS-CAL) 600 MG TABS   Oral   Take 600 mg by mouth every morning.         Marland Kitchen DIMETHICONE, TOPICAL, (SECURA DIMETHICONE PROTECTANT) 5 % CREA   Apply externally   Apply 1 application topically 3 (three) times daily. APPLY TOPICALLY WITH EACH INCONTINENT CHANGE         . divalproex (DEPAKOTE ER) 250 MG 24 hr tablet   Oral   Take 250 mg by mouth 3 (three) times daily.          Marland Kitchen  docusate (COLACE) 60 MG/15ML syrup   Oral   Take 60 mg by mouth daily.         . furosemide (LASIX) 20 MG tablet   Oral   Take 20 mg by mouth daily.         Marland Kitchen gabapentin (NEURONTIN) 300 MG capsule   Oral   Take 300 mg by mouth at bedtime.         Marland Kitchen guaiFENesin (IOPHEN-NR) 100 MG/5ML liquid   Oral   Take 200 mg by mouth every 6 (six) hours as needed. For cough         . loperamide (IMODIUM) 2 MG capsule   Oral   Take 2 mg by mouth 4 (four) times daily as needed. diarrhea         . LORazepam (ATIVAN) 0.5 MG tablet   Oral   Take 0.5 mg by mouth every 8 (eight) hours as needed.  anxiety         . mirtazapine (REMERON) 15 MG tablet   Oral   Take 15 mg by mouth daily.         . polyethylene glycol (MIRALAX / GLYCOLAX) packet   Oral   Take 17 g by mouth daily.         . risperiDONE (RISPERDAL) 0.5 MG tablet   Oral   Take 0.5 mg by mouth at bedtime.          . traMADol (ULTRAM) 50 MG tablet   Oral   Take 50 mg by mouth every 8 (eight) hours as needed for pain.         Marland Kitchen LORazepam (ATIVAN) 2 MG/ML concentrated solution   Oral   Take 2 mg by mouth 3 (three) times daily as needed. For agitation          BP 126/74  Pulse 70  Temp(Src) 98.4 F (36.9 C) (Axillary)  Resp 16  SpO2 98% Physical Exam  Constitutional: He appears well-nourished. No distress.  Thin, chronically ill apearing  HENT:  Head: Normocephalic and atraumatic.    Mouth/Throat: No oropharyngeal exudate.  Eyes: Pupils are equal, round, and reactive to light.  Neck: Normal range of motion. Neck supple.  Cardiovascular: Normal rate, regular rhythm and normal heart sounds.  Exam reveals no gallop and no friction rub.   No murmur heard. Pulmonary/Chest: Effort normal and breath sounds normal. No respiratory distress. He has no wheezes. He has no rales.  Abdominal: Soft. Bowel sounds are normal. He exhibits no distension and no mass. There is no tenderness. There is no rebound and no guarding.  Musculoskeletal: Normal range of motion. He exhibits no edema and no tenderness.  Neurological: He is alert. GCS eye subscore is 4. GCS verbal subscore is 2. GCS motor subscore is 5.  Could not follow commands, but could resist mvmts with equal strength  Skin: Skin is warm and dry.  Psychiatric: He has a normal mood and affect.    ED Course   Procedures (including critical care time)  LACERATION REPAIR Performed by: Toy Cookey, E Consent: Unable to reach family Patient identity confirmed: provided demographic data Time out performed prior to procedure Prepped and Draped in  normal sterile fashion Wound explored Laceration Location: L brow Laceration Length:2cm No Foreign Bodies seen or palpated Anesthesia: local infiltration Local anesthetic: lidocaine 2% w/ epinephrine Anesthetic total: 5 ml Irrigation method: syringe Amount of cleaning: standard Skin closure: 5-0 vicryl Number of sutures or staples: 9 Technique: simple interrupted Patient tolerance:  Patient tolerated the procedure well with no immediate complications.   Labs Reviewed - No data to display Ct Head Wo Contrast  07/01/2013   *RADIOLOGY REPORT*  Clinical Data:  Fall  CT HEAD WITHOUT CONTRAST CT CERVICAL SPINE WITHOUT CONTRAST  Technique:  Multidetector CT imaging of the head and cervical spine was performed following the standard protocol without intravenous contrast.  Multiplanar CT image reconstructions of the cervical spine were also generated.  Comparison:  11/04/2012  CT HEAD  Findings: Severe global atrophy.  Chronic ischemic changes in the periventricular white matter and basal ganglia.  No mass effect, midline shift, or acute intracranial hemorrhage.  Soft tissue injury over the left frontal bone is noted.  No underlying fracture.  IMPRESSION: No acute intracranial pathology.  CT CERVICAL SPINE  Findings: Motion artifact degrades the study.  No obvious fracture or dislocation.  Scattered degenerative changes throughout the cervical spine are present without obvious spinal stenosis.  No obvious spinal hematoma.  No obvious soft tissue injury. Parenchymal scarring at the lung apices is noted.  IMPRESSION: No acute bony injury in the cervical spine peri   Original Report Authenticated By: Jolaine Click, M.D.   Ct Cervical Spine Wo Contrast  07/01/2013   *RADIOLOGY REPORT*  Clinical Data:  Fall  CT HEAD WITHOUT CONTRAST CT CERVICAL SPINE WITHOUT CONTRAST  Technique:  Multidetector CT imaging of the head and cervical spine was performed following the standard protocol without intravenous contrast.   Multiplanar CT image reconstructions of the cervical spine were also generated.  Comparison:  11/04/2012  CT HEAD  Findings: Severe global atrophy.  Chronic ischemic changes in the periventricular white matter and basal ganglia.  No mass effect, midline shift, or acute intracranial hemorrhage.  Soft tissue injury over the left frontal bone is noted.  No underlying fracture.  IMPRESSION: No acute intracranial pathology.  CT CERVICAL SPINE  Findings: Motion artifact degrades the study.  No obvious fracture or dislocation.  Scattered degenerative changes throughout the cervical spine are present without obvious spinal stenosis.  No obvious spinal hematoma.  No obvious soft tissue injury. Parenchymal scarring at the lung apices is noted.  IMPRESSION: No acute bony injury in the cervical spine peri   Original Report Authenticated By: Jolaine Click, M.D.   1. Fall at nursing home, initial encounter   2. Facial laceration, initial encounter     MDM  Pt presents after witnessed fall from standing at nursing facility from wheelchair.  Pt reported to be at baseline mental status.  VSS, pt in NAD. Imaging ordered given age, mechanism and ASA use. CT head, c-spine unremarkable.  Facial laceration repaired as above.  I discussed d/c instructions and return precautions over the phone to pt's nurse.  Sutures should be removed in 5-7 days.    Shanna Cisco, MD 07/01/13 [phone]

## 2013-07-01 NOTE — ED Notes (Signed)
WJX:[id number]<WG> Expected date:07/01/13<BR> Expected time: 1:08 PM<BR> Means of arrival:Ambulance<BR> Comments:<BR> Flipped w/c head lac

## 2013-12-08 ENCOUNTER — Telehealth: Payer: Self-pay

## 2013-12-08 NOTE — Telephone Encounter (Signed)
[facility] [place] [facility] [address] [place] [facility] [employer]

## 2013-12-24 DEATH — deceased

## 2014-06-18 IMAGING — CT CT HEAD W/O CM
2 series · 16 of 30 positions shown, 19 images · non-contrast
Comparison: 05/10/2006

CLINICAL DATA: Fall.  Frontal head injury.

CT HEAD WITHOUT CONTRAST
TECHNIQUE: Contiguous axial images were obtained from the base of
the skull through the vertex without contrast.

[Series 2: head w/o · axial · non-contrast · 0.51mm/px · z∈[-259,-114]mm · 9 of 37 slices shown, 12 images]
[im 4/37  brain]
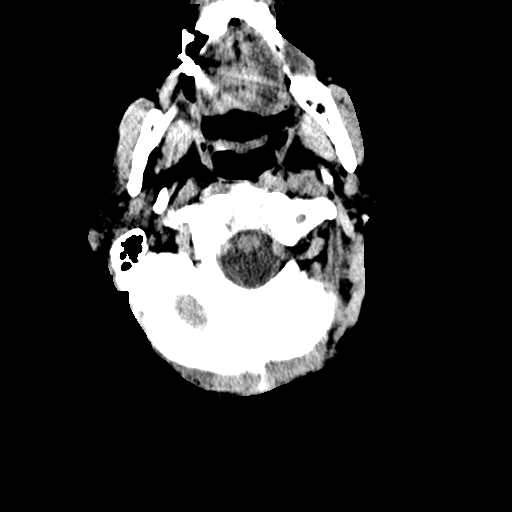
[im 4/37  bone]
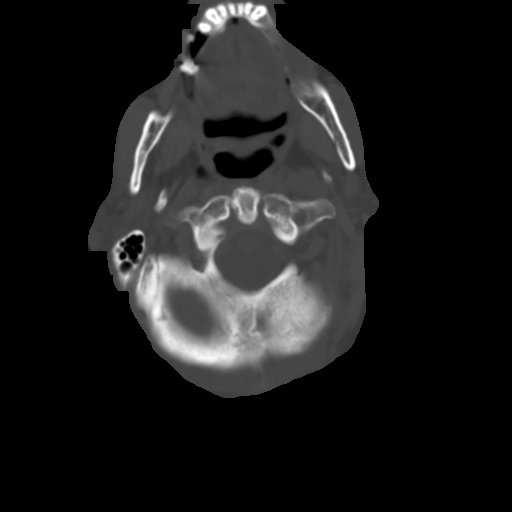
[im 8/37  brain]
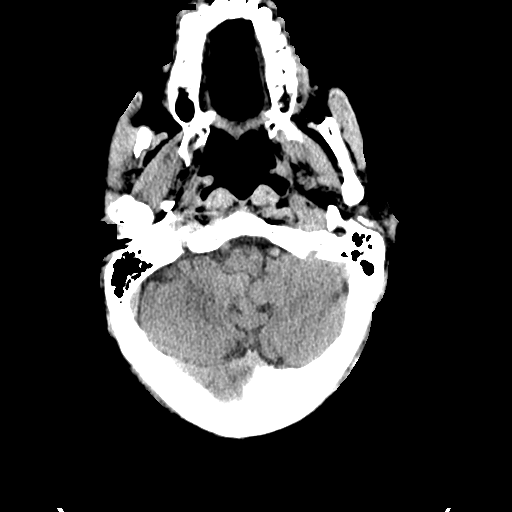
[im 11/37  brain]
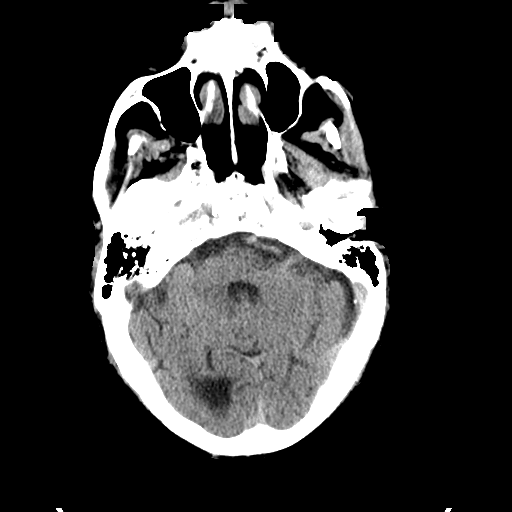
[im 15/37  brain]
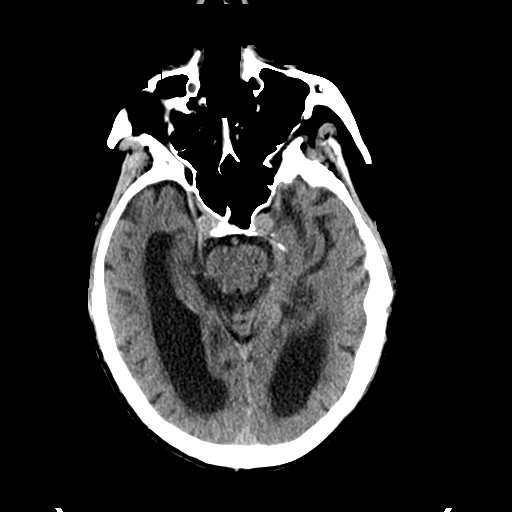
[im 19/37  brain]
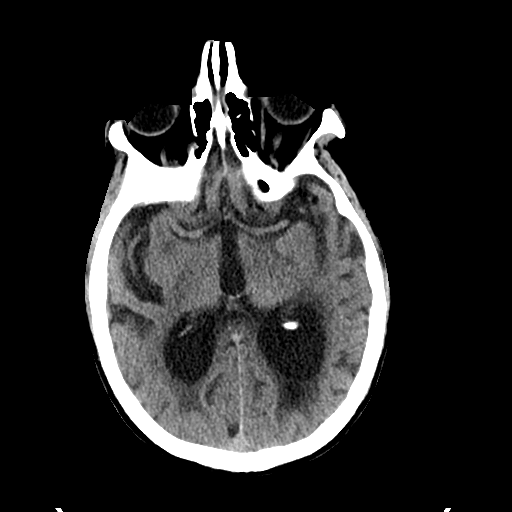
[im 19/37  bone]
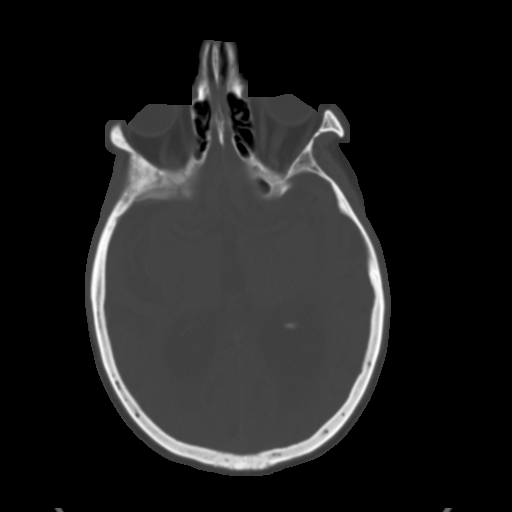
[im 22/37  brain]
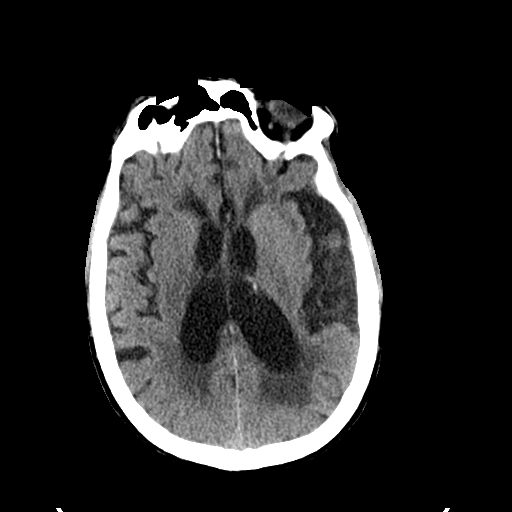
[im 26/37  brain]
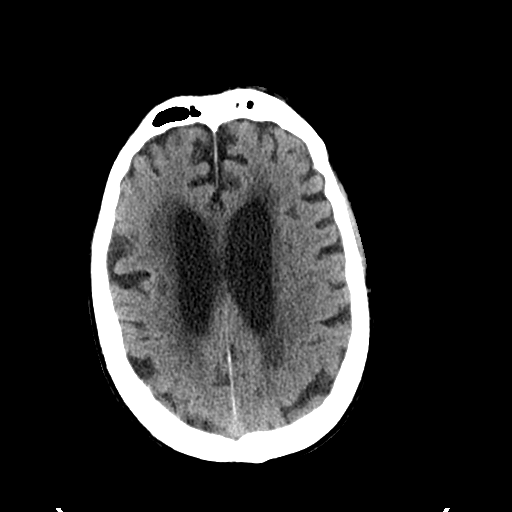
[im 29/37  brain]
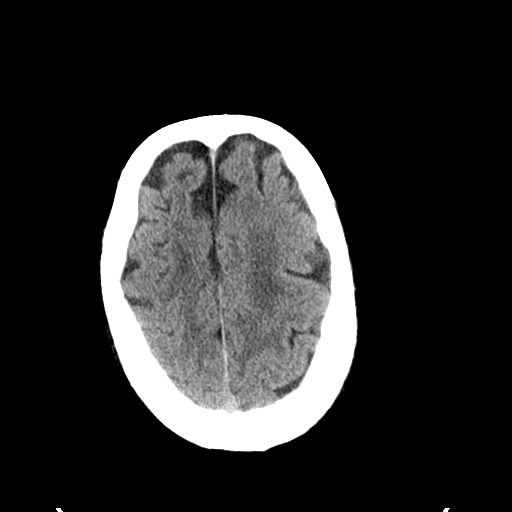
[im 33/37  brain]
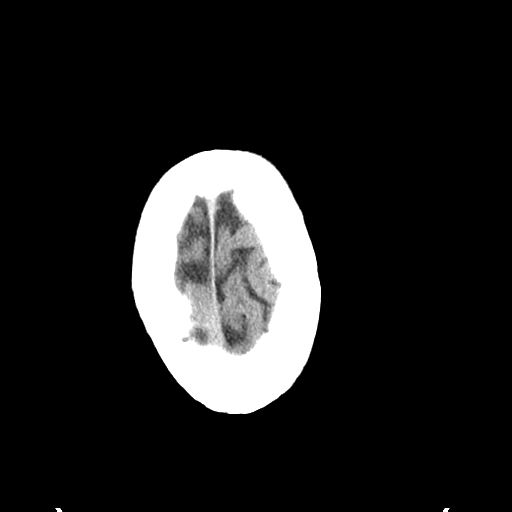
[im 33/37  bone]
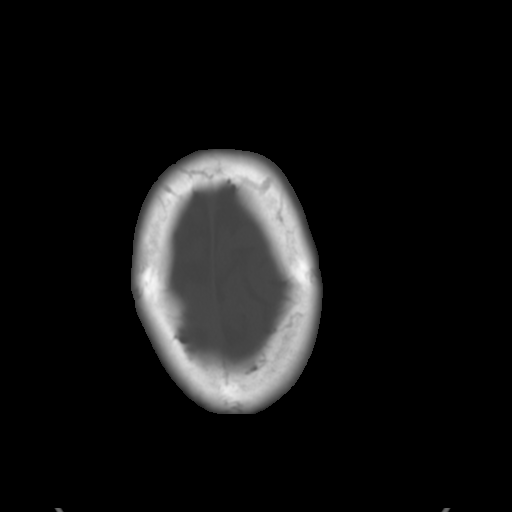

[Series 3: bone windows · axial · 0.51mm/px · z∈[-256,-136]mm · 7 of 61 slices shown]
[im 7/61  bone]
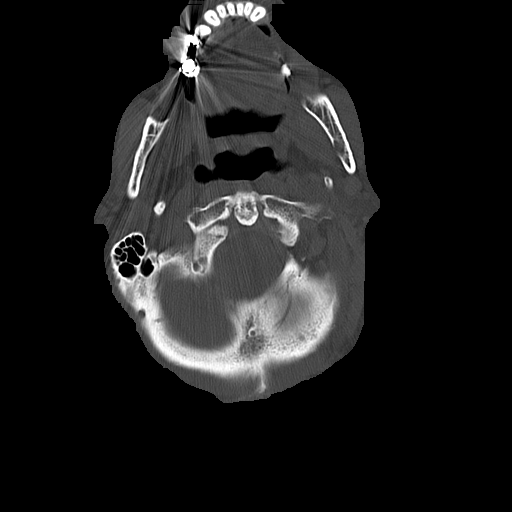
[im 14/61  bone]
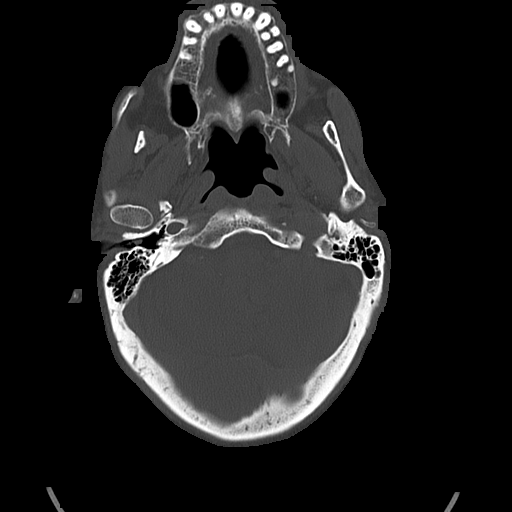
[im 21/61  bone]
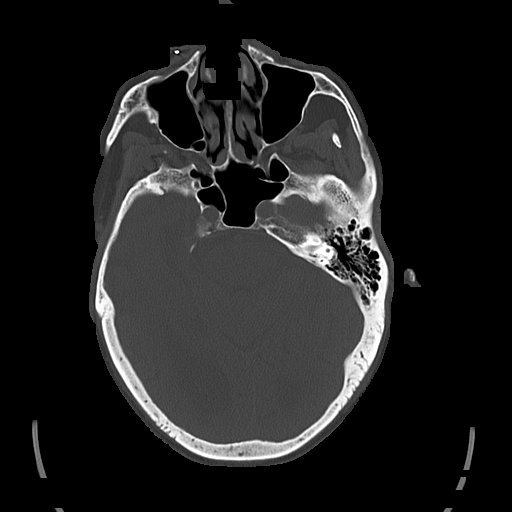
[im 27/61  bone]
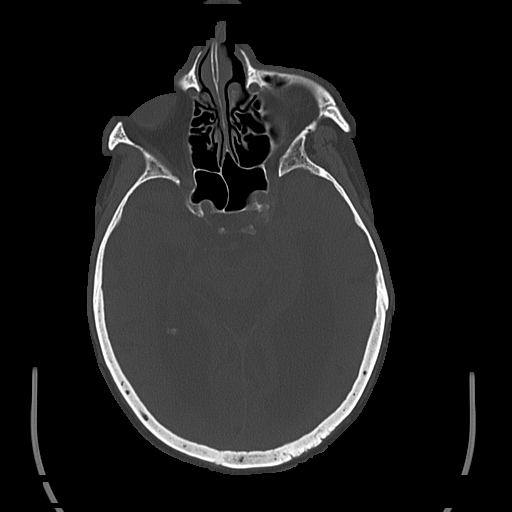
[im 34/61  bone]
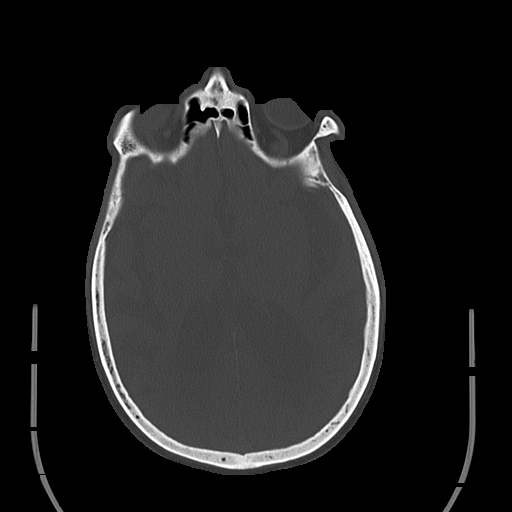
[im 41/61  bone]
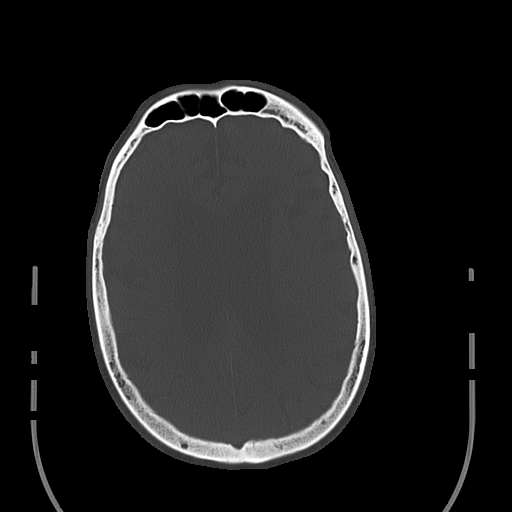
[im 47/61  bone]
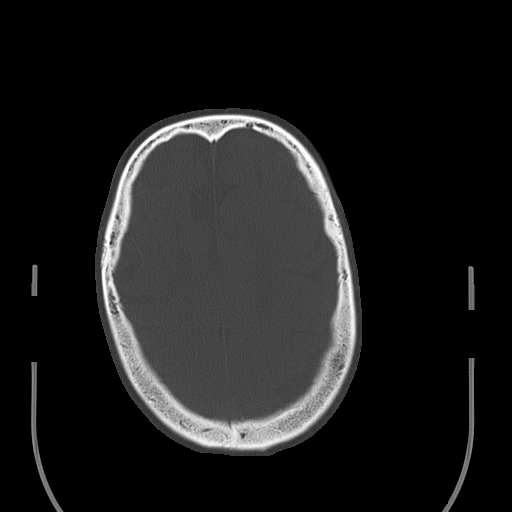

[16 of 30 positions shown; findings below may reference images not displayed]

FINDINGS: No evidence for acute hemorrhage.  No mass lesion or
acute ischemia by CT.  No abnormal extra-axial fluid collection.
Diffuse loss of parenchymal volume is consistent with atrophy.
Patchy low attenuation in the deep hemispheric and periventricular
white matter is nonspecific, but likely reflects chronic
microvascular ischemic demyelination.  The lateral ventricles are
enlarged bilaterally.  This was true previously, but has progressed
in the interval along with the progression of the diffuse
parenchymal volume loss.

Visualized paranasal sinuses are clear.  No evidence for skull
fracture.  Focal scalp swelling is seen in the left high frontal
parietal region.
IMPRESSION: No CT evidence for acute intracranial abnormality.

Advanced atrophy with chronic small vessel white matter disease.
The lateral ventricles are more prominent than before and while
this most likely represents progression of central atrophy,
hydrocephalus could have a similar appearance.

## 2014-08-30 IMAGING — CT CT CERVICAL SPINE W/O CM
1 of 3 series · 6 of 14 positions shown, 8 images · non-contrast
Comparison: CT 09/24/2014

CT HEAD

CLINICAL DATA: Fall

CT HEAD WITHOUT CONTRAST
CT CERVICAL SPINE WITHOUT CONTRAST
TECHNIQUE: Multidetector CT imaging of the head and cervical spine
was performed following the standard protocol without intravenous
contrast.  Multiplanar CT image reconstructions of the cervical
spine were also generated.

[Series 5: c-spine st · axial · 0.25mm/px · z∈[-272,-138]mm · 6 of 95 slices shown, 8 images]
[im 14/95  soft-tissue]
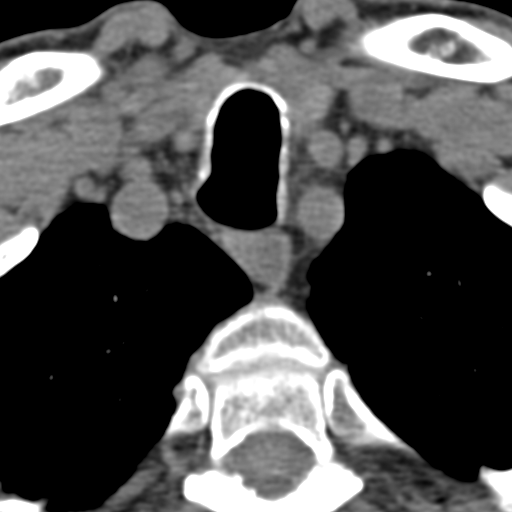
[im 14/95  bone]
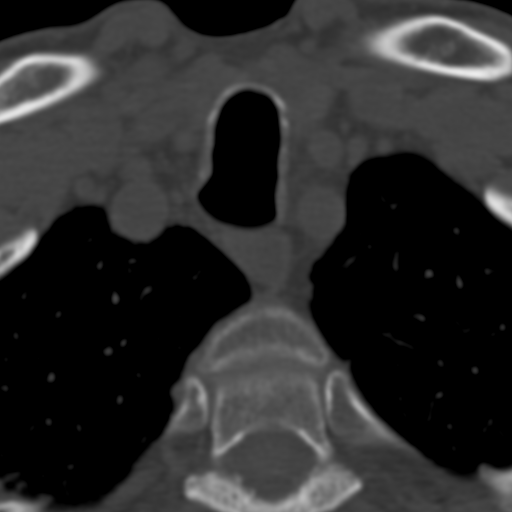
[im 27/95  bone]
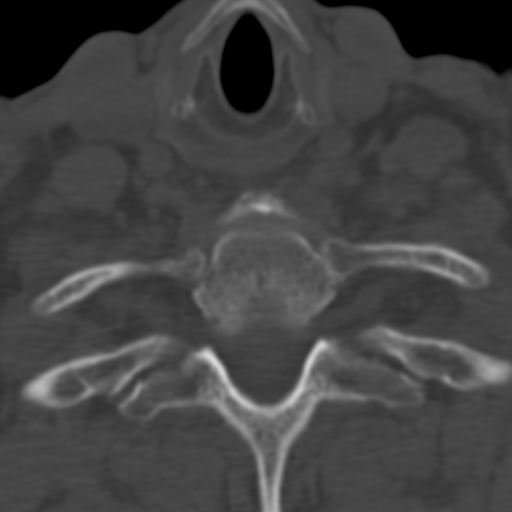
[im 41/95  bone]
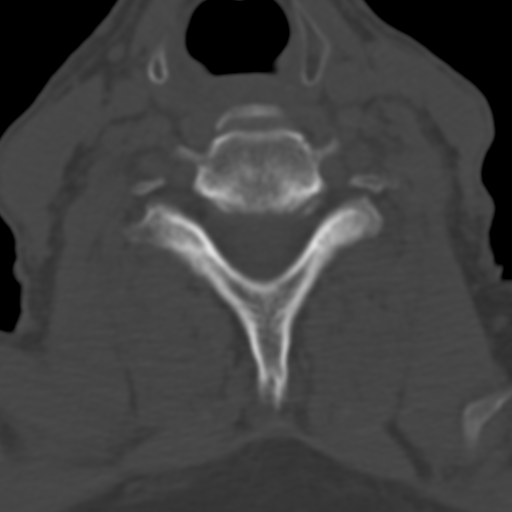
[im 54/95  bone]
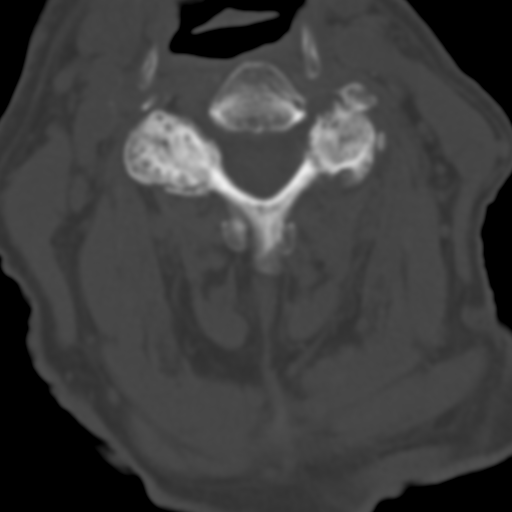
[im 68/95  soft-tissue]
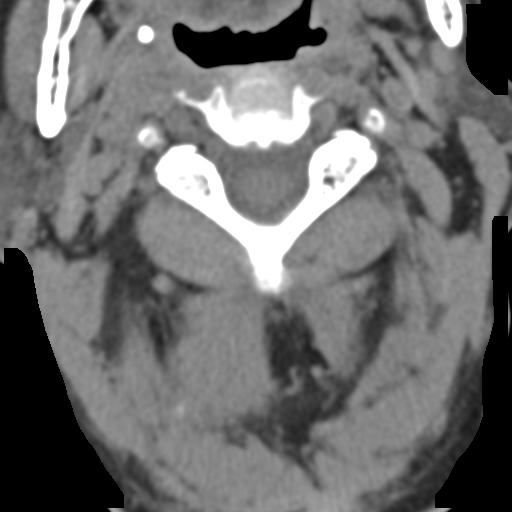
[im 68/95  bone]
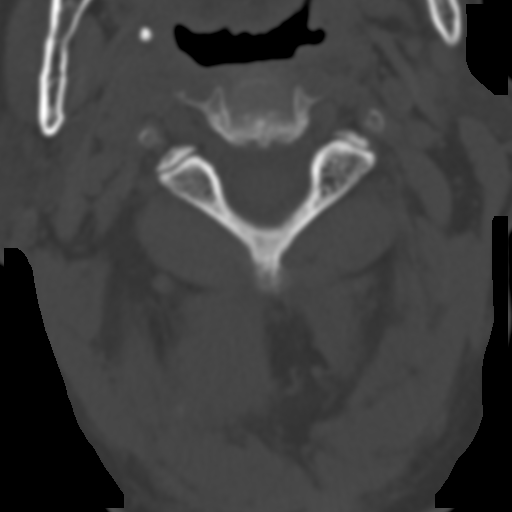
[im 81/95  bone]
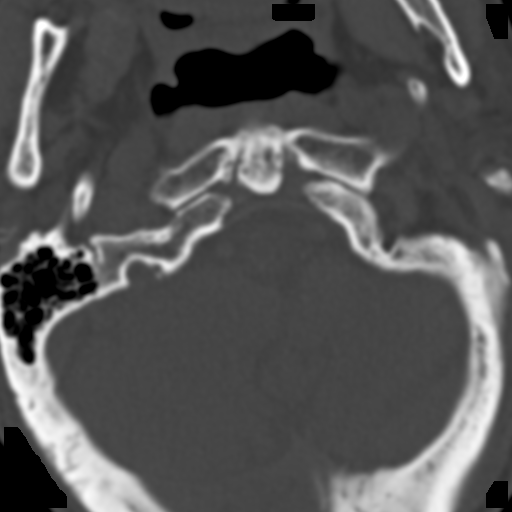

[6 of 14 positions shown; findings below may reference images not displayed]

FINDINGS: Advanced atrophy.  Chronic microvascular ischemic changes
in the white matter.  Negative for acute infarct.  Negative for
hemorrhage or mass lesion.  Negative for skull fracture.
IMPRESSION: Atrophy and chronic ischemic change.  No acute abnormality.

CT CERVICAL SPINE
FINDINGS: Negative for cervical spine fracture.  Disc degeneration
and facet degeneration are present throughout the cervical spine
without significant spinal stenosis.  Carotid artery calcification
bilaterally.
IMPRESSION: Negative for cervical spine fracture.
